# Patient Record
Sex: Female | Born: 1935 | Race: White | Hispanic: No | Marital: Married | Smoking: Never smoker
Health system: Southern US, Community
[De-identification: ages and names within clinical notes are randomized; demographics above are authoritative.]

## PROBLEM LIST (undated history)

## (undated) DIAGNOSIS — I2510 Atherosclerotic heart disease of native coronary artery without angina pectoris: Secondary | ICD-10-CM

## (undated) DIAGNOSIS — M1990 Unspecified osteoarthritis, unspecified site: Secondary | ICD-10-CM

## (undated) DIAGNOSIS — N189 Chronic kidney disease, unspecified: Secondary | ICD-10-CM

## (undated) DIAGNOSIS — F329 Major depressive disorder, single episode, unspecified: Secondary | ICD-10-CM

## (undated) DIAGNOSIS — F0390 Unspecified dementia without behavioral disturbance: Secondary | ICD-10-CM

## (undated) DIAGNOSIS — F32A Depression, unspecified: Secondary | ICD-10-CM

## (undated) DIAGNOSIS — E782 Mixed hyperlipidemia: Secondary | ICD-10-CM

## (undated) DIAGNOSIS — H409 Unspecified glaucoma: Secondary | ICD-10-CM

## (undated) DIAGNOSIS — E079 Disorder of thyroid, unspecified: Secondary | ICD-10-CM

## (undated) DIAGNOSIS — I10 Essential (primary) hypertension: Secondary | ICD-10-CM

## (undated) DIAGNOSIS — F419 Anxiety disorder, unspecified: Secondary | ICD-10-CM

## (undated) HISTORY — PX: ABDOMINAL HYSTERECTOMY: SHX81

---
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
## 2005-08-29 ENCOUNTER — Emergency Department: Payer: Self-pay | Admitting: Emergency Medicine

## 2005-08-29 ENCOUNTER — Other Ambulatory Visit: Payer: Self-pay

## 2007-08-21 ENCOUNTER — Observation Stay: Payer: Self-pay | Admitting: Endocrinology

## 2007-08-21 ENCOUNTER — Other Ambulatory Visit: Payer: Self-pay

## 2007-08-22 ENCOUNTER — Other Ambulatory Visit: Payer: Self-pay

## 2007-08-23 ENCOUNTER — Other Ambulatory Visit: Payer: Self-pay

## 2008-04-30 ENCOUNTER — Other Ambulatory Visit: Payer: Self-pay

## 2008-04-30 ENCOUNTER — Ambulatory Visit: Payer: Self-pay | Admitting: Ophthalmology

## 2008-05-13 ENCOUNTER — Ambulatory Visit: Payer: Self-pay | Admitting: Ophthalmology

## 2012-05-23 ENCOUNTER — Ambulatory Visit: Payer: Self-pay | Admitting: Family Medicine

## 2012-05-23 LAB — CREATININE, SERUM
EGFR (African American): 55 — ABNORMAL LOW
EGFR (Non-African Amer.): 48 — ABNORMAL LOW

## 2013-01-04 ENCOUNTER — Emergency Department: Payer: Self-pay | Admitting: Emergency Medicine

## 2013-10-07 ENCOUNTER — Emergency Department: Payer: Self-pay | Admitting: Emergency Medicine

## 2013-10-07 LAB — COMPREHENSIVE METABOLIC PANEL
ALBUMIN: 3.7 g/dL (ref 3.4–5.0)
ALT: 17 U/L (ref 12–78)
Alkaline Phosphatase: 63 U/L
Anion Gap: 4 — ABNORMAL LOW (ref 7–16)
BILIRUBIN TOTAL: 0.3 mg/dL (ref 0.2–1.0)
BUN: 19 mg/dL — ABNORMAL HIGH (ref 7–18)
CHLORIDE: 102 mmol/L (ref 98–107)
Calcium, Total: 8.6 mg/dL (ref 8.5–10.1)
Co2: 32 mmol/L (ref 21–32)
Creatinine: 1.05 mg/dL (ref 0.60–1.30)
EGFR (African American): 58 — ABNORMAL LOW
EGFR (Non-African Amer.): 50 — ABNORMAL LOW
GLUCOSE: 110 mg/dL — AB (ref 65–99)
Osmolality: 279 (ref 275–301)
POTASSIUM: 4.3 mmol/L (ref 3.5–5.1)
SGOT(AST): 26 U/L (ref 15–37)
SODIUM: 138 mmol/L (ref 136–145)
Total Protein: 7.2 g/dL (ref 6.4–8.2)

## 2013-10-07 LAB — URINALYSIS, COMPLETE
Bilirubin,UR: NEGATIVE
Blood: NEGATIVE
GLUCOSE, UR: NEGATIVE mg/dL (ref 0–75)
Ketone: NEGATIVE
Nitrite: NEGATIVE
PH: 7 (ref 4.5–8.0)
PROTEIN: NEGATIVE
RBC,UR: 1 /HPF (ref 0–5)
Specific Gravity: 1.009 (ref 1.003–1.030)
WBC UR: 2 /HPF (ref 0–5)

## 2013-10-07 LAB — CBC
HCT: 40.3 % (ref 35.0–47.0)
HGB: 13.4 g/dL (ref 12.0–16.0)
MCH: 28.7 pg (ref 26.0–34.0)
MCHC: 33.2 g/dL (ref 32.0–36.0)
MCV: 86 fL (ref 80–100)
Platelet: 226 10*3/uL (ref 150–440)
RBC: 4.67 10*6/uL (ref 3.80–5.20)
RDW: 13.5 % (ref 11.5–14.5)
WBC: 6 10*3/uL (ref 3.6–11.0)

## 2013-10-07 LAB — TROPONIN I: Troponin-I: <0.02 ng/mL

## 2013-10-09 ENCOUNTER — Emergency Department: Payer: Self-pay | Admitting: Emergency Medicine

## 2013-10-09 LAB — CBC
HCT: 38.8 % (ref 35.0–47.0)
HGB: 13 g/dL (ref 12.0–16.0)
MCH: 28.6 pg (ref 26.0–34.0)
MCHC: 33.4 g/dL (ref 32.0–36.0)
MCV: 86 fL (ref 80–100)
PLATELETS: 221 10*3/uL (ref 150–440)
RBC: 4.53 10*6/uL (ref 3.80–5.20)
RDW: 13.9 % (ref 11.5–14.5)
WBC: 5.7 10*3/uL (ref 3.6–11.0)

## 2013-10-09 LAB — URINALYSIS, COMPLETE
Bilirubin,UR: NEGATIVE
Blood: NEGATIVE
Glucose,UR: NEGATIVE mg/dL (ref 0–75)
Ketone: NEGATIVE
Nitrite: NEGATIVE
PROTEIN: NEGATIVE
Ph: 6 (ref 4.5–8.0)
RBC,UR: 3 /HPF (ref 0–5)
Specific Gravity: 1.021 (ref 1.003–1.030)

## 2013-10-09 LAB — COMPREHENSIVE METABOLIC PANEL
Albumin: 3.3 g/dL — ABNORMAL LOW (ref 3.4–5.0)
Alkaline Phosphatase: 57 U/L
Anion Gap: 4 — ABNORMAL LOW (ref 7–16)
BUN: 22 mg/dL — ABNORMAL HIGH (ref 7–18)
Bilirubin,Total: 0.4 mg/dL (ref 0.2–1.0)
CO2: 30 mmol/L (ref 21–32)
Calcium, Total: 8.6 mg/dL (ref 8.5–10.1)
Chloride: 103 mmol/L (ref 98–107)
Creatinine: 1.22 mg/dL (ref 0.60–1.30)
EGFR (African American): 48 — ABNORMAL LOW
GFR CALC NON AF AMER: 42 — AB
GLUCOSE: 88 mg/dL (ref 65–99)
OSMOLALITY: 277 (ref 275–301)
Potassium: 4.2 mmol/L (ref 3.5–5.1)
SGOT(AST): 23 U/L (ref 15–37)
SGPT (ALT): 15 U/L (ref 12–78)
Sodium: 137 mmol/L (ref 136–145)
TOTAL PROTEIN: 6.8 g/dL (ref 6.4–8.2)

## 2013-10-09 LAB — TSH: Thyroid Stimulating Horm: 3.69 u[IU]/mL

## 2013-10-09 LAB — TROPONIN I: Troponin-I: <0.02 ng/mL

## 2013-10-09 LAB — LIPASE, BLOOD: Lipase: 270 U/L (ref 73–393)

## 2013-10-09 LAB — T4, FREE: Free Thyroxine: 1.08 ng/dL (ref 0.76–1.46)

## 2013-10-11 LAB — URINE CULTURE

## 2013-12-16 ENCOUNTER — Ambulatory Visit: Payer: Self-pay | Admitting: Neurology

## 2015-12-13 ENCOUNTER — Encounter: Payer: Self-pay | Admitting: Emergency Medicine

## 2015-12-13 ENCOUNTER — Emergency Department: Payer: Medicare Other

## 2015-12-13 ENCOUNTER — Emergency Department
Admission: EM | Admit: 2015-12-13 | Discharge: 2015-12-13 | Disposition: A | Discharge status: Home / Self Care | Payer: Medicare Other | Attending: Emergency Medicine | Admitting: Emergency Medicine

## 2015-12-13 DIAGNOSIS — E079 Disorder of thyroid, unspecified: Secondary | ICD-10-CM | POA: Insufficient documentation

## 2015-12-13 DIAGNOSIS — M1990 Unspecified osteoarthritis, unspecified site: Secondary | ICD-10-CM | POA: Insufficient documentation

## 2015-12-13 DIAGNOSIS — R0789 Other chest pain: Secondary | ICD-10-CM | POA: Insufficient documentation

## 2015-12-13 DIAGNOSIS — R51 Headache: Secondary | ICD-10-CM | POA: Insufficient documentation

## 2015-12-13 DIAGNOSIS — I10 Essential (primary) hypertension: Secondary | ICD-10-CM | POA: Diagnosis not present

## 2015-12-13 DIAGNOSIS — R519 Headache, unspecified: Secondary | ICD-10-CM

## 2015-12-13 DIAGNOSIS — R911 Solitary pulmonary nodule: Secondary | ICD-10-CM | POA: Diagnosis not present

## 2015-12-13 DIAGNOSIS — Z791 Long term (current) use of non-steroidal anti-inflammatories (NSAID): Secondary | ICD-10-CM | POA: Insufficient documentation

## 2015-12-13 DIAGNOSIS — Z7982 Long term (current) use of aspirin: Secondary | ICD-10-CM | POA: Insufficient documentation

## 2015-12-13 DIAGNOSIS — Z79899 Other long term (current) drug therapy: Secondary | ICD-10-CM | POA: Diagnosis not present

## 2015-12-13 DIAGNOSIS — R079 Chest pain, unspecified: Secondary | ICD-10-CM

## 2015-12-13 HISTORY — DX: Anxiety disorder, unspecified: F41.9

## 2015-12-13 HISTORY — DX: Essential (primary) hypertension: I10

## 2015-12-13 HISTORY — DX: Disorder of thyroid, unspecified: E07.9

## 2015-12-13 HISTORY — DX: Unspecified osteoarthritis, unspecified site: M19.90

## 2015-12-13 HISTORY — DX: Unspecified dementia, unspecified severity, without behavioral disturbance, psychotic disturbance, mood disturbance, and anxiety: F03.90

## 2015-12-13 LAB — CBC
HEMATOCRIT: 37.9 % (ref 35.0–47.0)
HEMOGLOBIN: 12.8 g/dL (ref 12.0–16.0)
MCH: 28.5 pg (ref 26.0–34.0)
MCHC: 33.8 g/dL (ref 32.0–36.0)
MCV: 84.3 fL (ref 80.0–100.0)
Platelets: 269 10*3/uL (ref 150–440)
RBC: 4.49 MIL/uL (ref 3.80–5.20)
RDW: 14.5 % (ref 11.5–14.5)
WBC: 7.9 10*3/uL (ref 3.6–11.0)

## 2015-12-13 LAB — TROPONIN I: Troponin I: <0.03 ng/mL (ref <0.031)

## 2015-12-13 LAB — BASIC METABOLIC PANEL
ANION GAP: 7 (ref 5–15)
BUN: 18 mg/dL (ref 6–20)
CO2: 31 mmol/L (ref 22–32)
Calcium: 8.8 mg/dL — ABNORMAL LOW (ref 8.9–10.3)
Chloride: 101 mmol/L (ref 101–111)
Creatinine, Ser: 0.94 mg/dL (ref 0.44–1.00)
GFR calc Af Amer: >60 mL/min (ref >60)
GFR, EST NON AFRICAN AMERICAN: 55 mL/min — AB (ref >60)
GLUCOSE: 95 mg/dL (ref 65–99)
POTASSIUM: 3.6 mmol/L (ref 3.5–5.1)
Sodium: 139 mmol/L (ref 135–145)

## 2015-12-13 MED ORDER — IOPAMIDOL (ISOVUE-370) INJECTION 76%
100.0000 mL | Freq: Once | INTRAVENOUS | Status: AC | PRN
  Administered 2015-12-13: 100 mL via INTRAVENOUS

## 2015-12-13 MED ORDER — ATENOLOL 25 MG PO TABS
25.0000 mg | ORAL_TABLET | Freq: Once | ORAL | Status: AC
  Administered 2015-12-13: 25 mg via ORAL
  Filled 2015-12-13: qty 1

## 2015-12-13 NOTE — Discharge Instructions (Signed)
You were evaluated for chest discomfort, and although no certain cause was found, your exam and evaluation are reassuring in the emergency department today.  He will be referred to cardiology, Dr. Kirke Corin for close follow-up and possible stress testing.  Return to the emergency department for any worsening condition including trouble breathing, worsening chest pain, weakness, numbness, fever, dizziness or passing out, or any other symptoms concerning to you.   Nonspecific Chest Pain  Chest pain can be caused by many different conditions. There is always a chance that your pain could be related to something serious, such as a heart attack or a blood clot in your lungs. Chest pain can also be caused by conditions that are not life-threatening. If you have chest pain, it is very important to follow up with your health care provider. CAUSES  Chest pain can be caused by:  Heartburn.  Pneumonia or bronchitis.  Anxiety or stress.  Inflammation around your heart (pericarditis) or lung (pleuritis or pleurisy).  A blood clot in your lung.  A collapsed lung (pneumothorax). It can develop suddenly on its own (spontaneous pneumothorax) or from trauma to the chest.  Shingles infection (varicella-zoster virus).  Heart attack.  Damage to the bones, muscles, and cartilage that make up your chest wall. This can include:  Bruised bones due to injury.  Strained muscles or cartilage due to frequent or repeated coughing or overwork.  Fracture to one or more ribs.  Sore cartilage due to inflammation (costochondritis). RISK FACTORS  Risk factors for chest pain may include:  Activities that increase your risk for trauma or injury to your chest.  Respiratory infections or conditions that cause frequent coughing.  Medical conditions or overeating that can cause heartburn.  Heart disease or family history of heart disease.  Conditions or health behaviors that increase your risk of developing a  blood clot.  Having had chicken pox (varicella zoster). SIGNS AND SYMPTOMS Chest pain can feel like:  Burning or tingling on the surface of your chest or deep in your chest.  Crushing, pressure, aching, or squeezing pain.  Dull or sharp pain that is worse when you move, cough, or take a deep breath.  Pain that is also felt in your back, neck, shoulder, or arm, or pain that spreads to any of these areas. Your chest pain may come and go, or it may stay constant. DIAGNOSIS Lab tests or other studies may be needed to find the cause of your pain. Your health care provider may have you take a test called an ambulatory ECG (electrocardiogram). An ECG records your heartbeat patterns at the time the test is performed. You may also have other tests, such as:  Transthoracic echocardiogram (TTE). During echocardiography, sound waves are used to create a picture of all of the heart structures and to look at how blood flows through your heart.  Transesophageal echocardiogram (TEE).This is a more advanced imaging test that obtains images from inside your body. It allows your health care provider to see your heart in finer detail.  Cardiac monitoring. This allows your health care provider to monitor your heart rate and rhythm in real time.  Holter monitor. This is a portable device that records your heartbeat and can help to diagnose abnormal heartbeats. It allows your health care provider to track your heart activity for several days, if needed.  Stress tests. These can be done through exercise or by taking medicine that makes your heart beat more quickly.  Blood tests.  Imaging tests. TREATMENT  Your treatment depends on what is causing your chest pain. Treatment may include:  Medicines. These may include:  Acid blockers for heartburn.  Anti-inflammatory medicine.  Pain medicine for inflammatory conditions.  Antibiotic medicine, if an infection is present.  Medicines to dissolve blood  clots.  Medicines to treat coronary artery disease.  Supportive care for conditions that do not require medicines. This may include:  Resting.  Applying heat or cold packs to injured areas.  Limiting activities until pain decreases. HOME CARE INSTRUCTIONS  If you were prescribed an antibiotic medicine, finish it all even if you start to feel better.  Avoid any activities that bring on chest pain.  Do not use any tobacco products, including cigarettes, chewing tobacco, or electronic cigarettes. If you need help quitting, ask your health care provider.  Do not drink alcohol.  Take medicines only as directed by your health care provider.  Keep all follow-up visits as directed by your health care provider. This is important. This includes any further testing if your chest pain does not go away.  If heartburn is the cause for your chest pain, you may be told to keep your head raised (elevated) while sleeping. This reduces the chance that acid will go from your stomach into your esophagus.  Make lifestyle changes as directed by your health care provider. These may include:  Getting regular exercise. Ask your health care provider to suggest some activities that are safe for you.  Eating a heart-healthy diet. A registered dietitian can help you to learn healthy eating options.  Maintaining a healthy weight.  Managing diabetes, if necessary.  Reducing stress. SEEK MEDICAL CARE IF:  Your chest pain does not go away after treatment.  You have a rash with blisters on your chest.  You have a fever. SEEK IMMEDIATE MEDICAL CARE IF:   Your chest pain is worse.  You have an increasing cough, or you cough up blood.  You have severe abdominal pain.  You have severe weakness.  You faint.  You have chills.  You have sudden, unexplained chest discomfort.  You have sudden, unexplained discomfort in your arms, back, neck, or jaw.  You have shortness of breath at any  time.  You suddenly start to sweat, or your skin gets clammy.  You feel nauseous or you vomit.  You suddenly feel light-headed or dizzy.  Your heart begins to beat quickly, or it feels like it is skipping beats. These symptoms may represent a serious problem that is an emergency. Do not wait to see if the symptoms will go away. Get medical help right away. Call your local emergency services (911 in the [place] Do not drive yourself to the hospital.   This information is not intended to replace advice given to you by your health care provider. Make sure you discuss any questions you have with your health care provider.   Document Released: 04/26/2005 Document Revised: 08/07/2014 Document Reviewed: 02/20/2014 Elsevier Interactive Patient Education [employer]2016 Elsevier Inc.

## 2015-12-13 NOTE — ED Notes (Signed)
[facility] and stated that she began having bilateral chest pain that radiates down bilateral arms, that started about an hour prior to arrival.  EMS called.  Pt in NAD.

## 2015-12-13 NOTE — ED Provider Notes (Signed)
[facility] Provider Note   ____________________________________________  Time seen: I have reviewed the triage vital signs and the triage nursing note.  HISTORY  Chief Complaint Chest Pain   Historian Patient and multiple children  HPI Kelly [facility] is a 82 y.o. female with a history of hypertension, and anxiety, as well as some dementia, with no known coronary artery disease, is here for evaluation of chest discomfort which occurred essentially at rest this morning and was located in the center of the chest and extended to both arms. Mild nausea no vomiting. No abdominal pain. Denies GERD symptoms. No trouble breathing or coughing or fevers recently. She has had a mild global headache for couple of days. No weakness or numbness or confusion or new altered mental status.  Chest discomfort is mild now. Not made worse or better by anything. Not exertional. No palpitations.    Past Medical History  Diagnosis Date  . Hypertension   . Arthritis   . Dementia   . Thyroid disease   . Anxiety     There are no active problems to display for this patient.   Past Surgical History  Procedure Laterality Date  . Abdominal hysterectomy      Current Outpatient Rx  Name  Route  Sig  Dispense  Refill  . acetaminophen (TYLENOL) 500 MG tablet   Oral   Take 1,000 mg by mouth 2 (two) times daily. Pt also takes one tablet every six hours as needed for pain.         Marland Kitchen aspirin 81 MG chewable tablet   Oral   Chew 81 mg by mouth daily.         Marland Kitchen atenolol (TENORMIN) 25 MG tablet   Oral   Take 25 mg by mouth daily.         . cholecalciferol (VITAMIN D) 1000 units tablet   Oral   Take 2,000 Units by mouth daily.         Marland Kitchen docusate sodium (COLACE) 100 MG capsule   Oral   Take 100 mg by mouth daily.         Marland Kitchen donepezil (ARICEPT) 5 MG tablet   Oral   Take 5 mg by mouth at bedtime.         Marland Kitchen ipratropium (ATROVENT) 0.06 %  nasal spray   Each Nare   Place 2 sprays into both nostrils 3 (three) times daily.         Marland Kitchen lamoTRIgine (LAMICTAL) 25 MG tablet   Oral   Take 50 mg by mouth at bedtime.         Marland Kitchen latanoprost (XALATAN) 0.005 % ophthalmic solution   Right Eye   Place 1 drop into the right eye at bedtime.         Marland Kitchen levothyroxine (SYNTHROID, LEVOTHROID) 100 MCG tablet   Oral   Take 100 mcg by mouth daily before breakfast.         . loratadine (CLARITIN) 10 MG tablet   Oral   Take 10 mg by mouth daily.         Marland Kitchen losartan-hydrochlorothiazide (HYZAAR) 50-12.5 MG tablet   Oral   Take 1 tablet by mouth daily.         . Melatonin 3 MG TABS   Oral   Take 6 mg by mouth daily with supper.         . Omega-3 Fatty Acids (FISH OIL) 1200 MG CAPS   Oral  Take 1,200 mg by mouth daily.         . polyvinyl alcohol (LIQUIFILM TEARS) 1.4 % ophthalmic solution   Both Eyes   Place 1 drop into both eyes 4 (four) times daily.         . sertraline (ZOLOFT) 100 MG tablet   Oral   Take 100 mg by mouth daily.         Marland Kitchen triamcinolone cream (KENALOG) 0.1 %   Topical   Apply 1 application topically 2 (two) times daily.           Allergies Sudafed  No family history on file.  Social History Social History  Substance Use Topics  . Smoking status: Never Smoker   . Smokeless tobacco: None  . Alcohol Use: No    Review of Systems  Constitutional: Negative for fever. Eyes: Negative for visual changes. ENT: Negative for sore throat. Cardiovascular: Negative for Palpitations. Respiratory: Negative for shortness of breath. Gastrointestinal: Negative for abdominal pain, vomiting and diarrhea. Genitourinary: Negative for dysuria. Musculoskeletal: Negative for back pain. Skin: Negative for rash. Neurological: Positive for mild headache. 10 point Review of Systems otherwise negative ____________________________________________   PHYSICAL EXAM:  VITAL SIGNS: ED Triage Vitals  Enc  Vitals Group     BP 12/13/15 1030 168/89 mmHg     Pulse Rate 12/13/15 1030 69     Resp 12/13/15 1030 26     Temp 12/13/15 1030 97.6 F (36.4 C)     Temp Source 12/13/15 1030 Oral     SpO2 12/13/15 1030 98 %     Weight 12/13/15 1045 184 lb (83.462 kg)     Height 12/13/15 1045 5\' 4"  (1.626 m)     Head Cir --      Peak Flow --      Pain Score 12/13/15 1031 9     Pain Loc --      Pain Edu? --      Excl. in GC? --      Constitutional: Alert and oriented. Well appearing and in no distress. HEENT   Head: Normocephalic and atraumatic.      Eyes: Conjunctivae are normal. PERRL. Normal extraocular movements.      Ears:         Nose: No congestion/rhinnorhea.   Mouth/Throat: Mucous membranes are moist.   Neck: No stridor. Cardiovascular/Chest: Normal rate, regular rhythm.  No murmurs, rubs, or gallops. Respiratory: Normal respiratory effort without tachypnea nor retractions. Breath sounds are clear and equal bilaterally. No wheezes/rales/rhonchi. Gastrointestinal: Soft. No distention, no guarding, no rebound. Nontender.    Genitourinary/rectal:Deferred Musculoskeletal: Nontender with normal range of motion in all extremities. No joint effusions.  No lower extremity tenderness.  No edema. Neurologic:  Normal speech and language. No gross or focal neurologic deficits are appreciated. Skin:  Skin is warm, dry and intact. No rash noted. Psychiatric: Mood and affect are normal. Speech and behavior are normal. Patient exhibits appropriate insight and judgment.  ____________________________________________   EKG I, Governor Rooks, MD, the attending physician have personally viewed and interpreted all ECGs.  69 bpm. Normal sinus rhythm. Incomplete left bundle branch block. Normal axis. Nonspecific T-wave ____________________________________________  LABS (pertinent positives/negatives)  Basic metabolic panel within normal limits CBC within normal limits Troponin less than  0.03 Repeat troponin less than 0.03  ____________________________________________  RADIOLOGY All Xrays were viewed by me. Imaging interpreted by Radiologist.  Chest two-view: No active cardiopulmonary disease  CT head without contrast: No evidence of acute intracranial  abnormality.  CT angio chest aorta:  IMPRESSION: No evidence of pulmonary embolus.  Atherosclerosis thoracic aorta without aneurysm or dissection.  Focal ill-defined opacity seen posteriorly in right upper lobe concerning for atelectasis, inflammation or possibly scarring.  4 mm nodule seen in right upper lobe. No follow-up needed if patient is low-risk. Non-contrast chest CT can be considered in 12 months if patient is high-risk. This recommendation follows the consensus statement: Guidelines for Management of Incidental Pulmonary Nodules Detected on CT Images:From the Fleischner Society 2017; published online before print (10.1148/[id number]). __________________________________________  PROCEDURES  Procedure(s) performed: None  Critical Care performed: None  ____________________________________________   ED COURSE / ASSESSMENT AND PLAN  Pertinent labs & imaging results that were available during my care of the patient were reviewed by me and considered in my medical decision making (see chart for details).   She came here because of chest discomfort, and her symptoms sound atypical for cardiac, however she is a couple risk factors and has not had coronary artery disease workup in the past.  Her EKG is reassuring. Her troponin as well as repeat troponin are also reassuring.  Her symptoms do not sound good leg. Her symptoms do not sound like pulmonary embolus.  If anything, was a little concerned about vascular or aortic emergency and I did obtain a chest CT with contrast and this was negative for acute emergency. I did discuss with her the need for follow-up for the pulmonary nodule that was  seen as a nonemergency follow-up with her primary care physician.  I did also CT her head out of a complaint of generalized headache for couple of days. Next the next line originally I was considering CT angiogram of the neck, but without one-sided pain I think dissection of the carotid artery is less likely, and was only able to do either chest or neck given the contrast dye load.  Her exam and evaluation are reassuring today. I am going to have her follow-up with cardiology I spoke with Dr. Kirke CorinArida who was office staff will call her for an appointment in the next 1-2 days.    CONSULTATIONS:   I spoke with Dr. Kirke CorinArida, cardiology by phone.   Patient / Family / Caregiver informed of clinical course, medical decision-making process, and agree with plan.   I discussed return precautions, follow-up instructions, and discharged instructions with patient and/or family.   ___________________________________________   FINAL CLINICAL IMPRESSION(S) / ED DIAGNOSES   Final diagnoses:  Nonspecific chest pain  Nodule of right lung  Acute nonintractable headache, unspecified headache type              Note: This dictation was prepared with Dragon dictation. Any transcriptional errors that result from this process are unintentional   Governor Rooksebecca Milik Gilreath, MD 12/13/15 1557

## 2016-06-06 ENCOUNTER — Encounter
Admission: RE | Disposition: A | Discharge status: Home / Self Care | Payer: Self-pay | Source: Ambulatory Visit | Attending: Internal Medicine

## 2016-06-06 ENCOUNTER — Encounter: Payer: Self-pay | Admitting: Internal Medicine

## 2016-06-06 ENCOUNTER — Ambulatory Visit
Admission: RE | Admit: 2016-06-06 | Discharge: 2016-06-06 | Disposition: A | Discharge status: Home / Self Care | Payer: Medicare Other | Source: Ambulatory Visit | Attending: Internal Medicine | Admitting: Internal Medicine

## 2016-06-06 DIAGNOSIS — R0602 Shortness of breath: Secondary | ICD-10-CM | POA: Diagnosis present

## 2016-06-06 DIAGNOSIS — Z791 Long term (current) use of non-steroidal anti-inflammatories (NSAID): Secondary | ICD-10-CM | POA: Insufficient documentation

## 2016-06-06 DIAGNOSIS — E039 Hypothyroidism, unspecified: Secondary | ICD-10-CM | POA: Insufficient documentation

## 2016-06-06 DIAGNOSIS — E785 Hyperlipidemia, unspecified: Secondary | ICD-10-CM | POA: Diagnosis not present

## 2016-06-06 DIAGNOSIS — E7800 Pure hypercholesterolemia, unspecified: Secondary | ICD-10-CM | POA: Insufficient documentation

## 2016-06-06 DIAGNOSIS — Z79899 Other long term (current) drug therapy: Secondary | ICD-10-CM | POA: Diagnosis not present

## 2016-06-06 DIAGNOSIS — F329 Major depressive disorder, single episode, unspecified: Secondary | ICD-10-CM | POA: Diagnosis not present

## 2016-06-06 DIAGNOSIS — Z7982 Long term (current) use of aspirin: Secondary | ICD-10-CM | POA: Insufficient documentation

## 2016-06-06 DIAGNOSIS — F0390 Unspecified dementia without behavioral disturbance: Secondary | ICD-10-CM | POA: Diagnosis not present

## 2016-06-06 DIAGNOSIS — I25119 Atherosclerotic heart disease of native coronary artery with unspecified angina pectoris: Secondary | ICD-10-CM | POA: Insufficient documentation

## 2016-06-06 DIAGNOSIS — I10 Essential (primary) hypertension: Secondary | ICD-10-CM | POA: Insufficient documentation

## 2016-06-06 DIAGNOSIS — I208 Other forms of angina pectoris: Secondary | ICD-10-CM | POA: Diagnosis present

## 2016-06-06 HISTORY — PX: CARDIAC CATHETERIZATION: SHX172

## 2016-06-06 SURGERY — LEFT HEART CATH AND CORONARY ANGIOGRAPHY
Anesthesia: Moderate Sedation | Laterality: Left

## 2016-06-06 MED ORDER — HEPARIN (PORCINE) IN NACL 2-0.9 UNIT/ML-% IJ SOLN
INTRAMUSCULAR | Status: AC
  Filled 2016-06-06: qty 500

## 2016-06-06 MED ORDER — IOPAMIDOL (ISOVUE-300) INJECTION 61%
INTRAVENOUS | Status: DC | PRN
  Administered 2016-06-06: 130 mL via INTRA_ARTERIAL

## 2016-06-06 MED ORDER — FENTANYL CITRATE (PF) 100 MCG/2ML IJ SOLN
INTRAMUSCULAR | Status: AC
  Filled 2016-06-06: qty 2

## 2016-06-06 MED ORDER — SODIUM CHLORIDE 0.9 % IV SOLN
INTRAVENOUS | Status: DC
  Administered 2016-06-06: 12:00:00 via INTRAVENOUS

## 2016-06-06 MED ORDER — SODIUM CHLORIDE 0.9% FLUSH
10.0000 mL | Freq: Once | INTRAVENOUS | Status: AC
  Administered 2016-06-06: 10 mL via INTRAVENOUS

## 2016-06-06 MED ORDER — MIDAZOLAM HCL 2 MG/2ML IJ SOLN
INTRAMUSCULAR | Status: AC
  Filled 2016-06-06: qty 2

## 2016-06-06 MED ORDER — MIDAZOLAM HCL 2 MG/2ML IJ SOLN
INTRAMUSCULAR | Status: DC | PRN
  Administered 2016-06-06: 1 mg via INTRAVENOUS

## 2016-06-06 MED ORDER — FENTANYL CITRATE (PF) 100 MCG/2ML IJ SOLN
INTRAMUSCULAR | Status: DC | PRN
  Administered 2016-06-06: 25 ug via INTRAVENOUS

## 2016-06-06 SURGICAL SUPPLY — 9 items
CATH 5FR JR4 DIAGNOSTIC (CATHETERS) ×3 IMPLANT
CATH INFINITI 5FR ANG PIGTAIL (CATHETERS) ×3 IMPLANT
CATH INFINITI 5FR JL4 (CATHETERS) ×3 IMPLANT
DEVICE CLOSURE MYNXGRIP 5F (Vascular Products) ×3 IMPLANT
GUIDEWIRE 3MM J TIP .035 145 (WIRE) ×3 IMPLANT
KIT MANI 3VAL PERCEP (MISCELLANEOUS) ×3 IMPLANT
NEEDLE PERC 18GX7CM (NEEDLE) ×3 IMPLANT
PACK CARDIAC CATH (CUSTOM PROCEDURE TRAY) ×3 IMPLANT
SHEATH PINNACLE 5F 10CM (SHEATH) ×3 IMPLANT

## 2016-06-06 NOTE — Discharge Instructions (Signed)
Angiogram, Care After °Refer to this sheet in the next few weeks. These instructions provide you with information about caring for yourself after your procedure. Your health care provider may also give you more specific instructions. Your treatment has been planned according to current medical practices, but problems sometimes occur. Call your health care provider if you have any problems or questions after your procedure. °WHAT TO EXPECT AFTER THE PROCEDURE °After your procedure, it is typical to have the following: °Bruising at the catheter insertion site that usually fades within 1-2 weeks. °Blood collecting in the tissue (hematoma) that may be painful to the touch. It should usually decrease in size and tenderness within 1-2 weeks. °HOME CARE INSTRUCTIONS °Take medicines only as directed by your health care provider. °You may shower 24-48 hours after the procedure or as directed by your health care provider. Remove the bandage (dressing) and gently wash the site with plain soap and water. Pat the area dry with a clean towel. Do not rub the site, because this may cause bleeding. °Do not take baths, swim, or use a hot tub until your health care provider approves. °Check your insertion site every day for redness, swelling, or drainage. °Do not apply powder or lotion to the site. °Do not lift over 10 lb (4.5 kg) for 5 days after your procedure or as directed by your health care provider. °Ask your health care provider when it is okay to: °Return to work or school. °Resume usual physical activities or sports. °Resume sexual activity. °Do not drive home if you are discharged the same day as the procedure. Have someone else drive you. °You may drive 24 hours after the procedure unless otherwise instructed by your health care provider. °Do not operate machinery or power tools for 24 hours after the procedure or as directed by your health care provider. °If your procedure was done as an outpatient procedure, which means  that you went home the same day as your procedure, a responsible adult should be with you for the first 24 hours after you arrive home. °Keep all follow-up visits as directed by your health care provider. This is important. °SEEK MEDICAL CARE IF: °You have a fever. °You have chills. °You have increased bleeding from the catheter insertion site. Hold pressure on the site. °SEEK IMMEDIATE MEDICAL CARE IF: °You have unusual pain at the catheter insertion site. °You have redness, warmth, or swelling at the catheter insertion site. °You have drainage (other than a small amount of blood on the dressing) from the catheter insertion site. °The catheter insertion site is bleeding, and the bleeding does not stop after 30 minutes of holding steady pressure on the site. °The area near or just beyond the catheter insertion site becomes pale, cool, tingly, or numb. °  °This information is not intended to replace advice given to you by your health care provider. Make sure you discuss any questions you have with your health care provider. °  °Document Released: 02/02/2005 Document Revised: 08/07/2014 Document Reviewed: 12/18/2012 °Elsevier Interactive Patient Education ©2016 Elsevier Inc. °Coronary Angiogram °A coronary angiogram, also called coronary angiography, is an X-ray procedure used to look at the arteries in the heart. In this procedure, a dye (contrast dye) is injected through a long, hollow tube (catheter). The catheter is about the size of a piece of cooked spaghetti and is inserted through your groin, wrist, or arm. The dye is injected into each artery, and X-rays are then taken to show if there is a blockage   in the arteries of your heart. °LET YOUR HEALTH CARE PROVIDER KNOW ABOUT: °· Any allergies you have, including allergies to shellfish or contrast dye.   °· All medicines you are taking, including vitamins, herbs, eye drops, creams, and over-the-counter medicines.   °· Previous problems you or members of your  family have had with the use of anesthetics.   °· Any blood disorders you have.   °· Previous surgeries you have had. °· History of kidney problems or failure.   °· Other medical conditions you have. °RISKS AND COMPLICATIONS  °Generally, a coronary angiogram is a safe procedure. However, problems can occur and include: °· Allergic reaction to the dye. °· Bleeding from the access site or other locations. °· Kidney injury, especially in people with impaired kidney function.  °· Stroke (rare). °· Heart attack (rare). °BEFORE THE PROCEDURE  °· Do not eat or drink anything after midnight the night before the procedure or as directed by your health care provider.   °· Ask your health care provider about changing or stopping your regular medicines. This is especially important if you are taking diabetes medicines or blood thinners. °PROCEDURE °· You may be given a medicine to help you relax (sedative) before the procedure. This medicine is given through an intravenous (IV) access tube that is inserted into one of your veins.   °· The area where the catheter will be inserted will be washed and shaved. This is usually done in the groin but may be done in the fold of your arm (near your elbow) or in the wrist.    °· A medicine will be given to numb the area where the catheter will be inserted (local anesthetic).   °· The health care provider will insert the catheter into an artery. The catheter will be guided by using a special type of X-ray (fluoroscopy) of the blood vessel being examined.   °· A special dye will then be injected into the catheter, and X-rays will be taken. The dye will help to show where any narrowing or blockages are located in the heart arteries.   °AFTER THE PROCEDURE  °· If the procedure is done through the leg, you will be kept in bed lying flat for several hours. You will be instructed to not bend or cross your legs. °· The insertion site will be checked frequently.   °· The pulse in your feet or  wrist will be checked frequently.   °· Additional blood tests, X-rays, and an electrocardiogram may be done.   °  °This information is not intended to replace advice given to you by your health care provider. Make sure you discuss any questions you have with your health care provider. °  °Document Released: 01/21/2003 Document Revised: 08/07/2014 Document Reviewed: 12/09/2012 °Elsevier Interactive Patient Education ©2016 Else[employer] Inc. ° °

## 2016-07-21 ENCOUNTER — Ambulatory Visit: Payer: Medicare Other | Attending: Internal Medicine

## 2016-07-21 DIAGNOSIS — G4733 Obstructive sleep apnea (adult) (pediatric): Secondary | ICD-10-CM | POA: Insufficient documentation

## 2016-07-21 DIAGNOSIS — G4761 Periodic limb movement disorder: Secondary | ICD-10-CM | POA: Insufficient documentation

## 2017-10-20 ENCOUNTER — Emergency Department
Admission: EM | Admit: 2017-10-20 | Discharge: 2017-10-21 | Disposition: A | Discharge status: Home / Self Care | Payer: Medicare Other | Attending: Emergency Medicine | Admitting: Emergency Medicine

## 2017-10-20 ENCOUNTER — Other Ambulatory Visit: Payer: Self-pay

## 2017-10-20 ENCOUNTER — Emergency Department: Payer: Medicare Other

## 2017-10-20 DIAGNOSIS — Z79899 Other long term (current) drug therapy: Secondary | ICD-10-CM | POA: Diagnosis not present

## 2017-10-20 DIAGNOSIS — F0390 Unspecified dementia without behavioral disturbance: Secondary | ICD-10-CM | POA: Diagnosis not present

## 2017-10-20 DIAGNOSIS — Z7982 Long term (current) use of aspirin: Secondary | ICD-10-CM | POA: Insufficient documentation

## 2017-10-20 DIAGNOSIS — R1030 Lower abdominal pain, unspecified: Secondary | ICD-10-CM

## 2017-10-20 DIAGNOSIS — K5900 Constipation, unspecified: Secondary | ICD-10-CM | POA: Insufficient documentation

## 2017-10-20 DIAGNOSIS — I10 Essential (primary) hypertension: Secondary | ICD-10-CM | POA: Diagnosis not present

## 2017-10-20 NOTE — ED Notes (Signed)
Pt disimpacted per dr. Zenda AlpersWebster of large amount of brown hard stool, approx 1/2 of bedpan full. Pt tolerated procedure well.

## 2017-10-20 NOTE — ED Triage Notes (Signed)
Pt sent in for constipation - was given a fleets enema at 6pm by nh with no results. Hx of constipation

## 2017-10-21 ENCOUNTER — Encounter: Payer: Self-pay | Admitting: Radiology

## 2017-10-21 ENCOUNTER — Emergency Department: Payer: Medicare Other

## 2017-10-21 LAB — COMPREHENSIVE METABOLIC PANEL
ALK PHOS: 50 U/L (ref 38–126)
ALT: 10 U/L — AB (ref 14–54)
ANION GAP: 15 (ref 5–15)
AST: 29 U/L (ref 15–41)
Albumin: 4.2 g/dL (ref 3.5–5.0)
BUN: 24 mg/dL — ABNORMAL HIGH (ref 6–20)
CALCIUM: 9.1 mg/dL (ref 8.9–10.3)
CO2: 24 mmol/L (ref 22–32)
CREATININE: 1.21 mg/dL — AB (ref 0.44–1.00)
Chloride: 92 mmol/L — ABNORMAL LOW (ref 101–111)
GFR, EST AFRICAN AMERICAN: 46 mL/min — AB (ref >60)
GFR, EST NON AFRICAN AMERICAN: 40 mL/min — AB (ref >60)
Glucose, Bld: 123 mg/dL — ABNORMAL HIGH (ref 65–99)
Potassium: 3.1 mmol/L — ABNORMAL LOW (ref 3.5–5.1)
SODIUM: 131 mmol/L — AB (ref 135–145)
Total Bilirubin: 0.7 mg/dL (ref 0.3–1.2)
Total Protein: 7.9 g/dL (ref 6.5–8.1)

## 2017-10-21 LAB — CBC
HCT: 35.8 % (ref 35.0–47.0)
HEMOGLOBIN: 12.1 g/dL (ref 12.0–16.0)
MCH: 28.3 pg (ref 26.0–34.0)
MCHC: 33.8 g/dL (ref 32.0–36.0)
MCV: 83.8 fL (ref 80.0–100.0)
Platelets: 276 10*3/uL (ref 150–440)
RBC: 4.28 MIL/uL (ref 3.80–5.20)
RDW: 14.8 % — ABNORMAL HIGH (ref 11.5–14.5)
WBC: 13.6 10*3/uL — ABNORMAL HIGH (ref 3.6–11.0)

## 2017-10-21 MED ORDER — POLYETHYLENE GLYCOL 3350 17 G PO PACK: 17.0000 g | PACK | Freq: Two times a day (BID) | ORAL | 0 refills | Status: AC

## 2017-10-21 MED ORDER — LACTULOSE 10 GM/15ML PO SOLN: 10.0000 g | Freq: Two times a day (BID) | ORAL | 0 refills | Status: DC

## 2017-10-21 MED ORDER — SODIUM CHLORIDE 0.9 % IV BOLUS (SEPSIS)
500.0000 mL | Freq: Once | INTRAVENOUS | Status: AC
  Administered 2017-10-21: 500 mL via INTRAVENOUS

## 2017-10-21 MED ORDER — IOPAMIDOL (ISOVUE-300) INJECTION 61%
75.0000 mL | Freq: Once | INTRAVENOUS | Status: AC | PRN
  Administered 2017-10-21: 75 mL via INTRAVENOUS

## 2017-10-21 NOTE — ED Notes (Signed)
Pt assisted up to commode for possible stool production without results. Pt assisted back to bed, warm blankets provided, call bell at right side.

## 2017-10-21 NOTE — Discharge Instructions (Addendum)
Please take MiraLAX twice a day for the next 7 days as well as lactulose to help induce bowel movements.  Please ensure that you are drinking adequate amounts of water and eating a high-fiber diet.  Please return with any fevers, vomiting or any other concerns.

## 2017-10-21 NOTE — ED Provider Notes (Signed)
[facility] Emergency Department Provider Note   ____________________________________________   First MD Initiated Contact with Patient 10/20/17 2317     (approximate)  I have reviewed the triage vital signs and the nursing notes.   HISTORY  Chief Complaint Constipation    HPI Kelly Sullivan is a 84 y.o. female who comes into the hospital today stating that her bowels are locked.  The patient's family states that she is been complaining for a few months about not being able to go to the restroom.  They have been to the doctor and was told it was due to medication but she is not sure exactly what medication is causing her constipation.  The patient states that she went to the doctor earlier this week and was told that she is not constipated after they pressed on her belly but the plan was to do more testing should the patient still complained of constipation.  She has been taking Colace and another medication that she does not know.  The family states that she is lost about 40 pounds in the last 4-5 months.  She was hurting all days of the nursing home center here for evaluation.  She is been trying to go to the bathroom.  The patient rates her pain 8-9 out of 10 in intensity.  She has not had any nausea or vomiting nor has she had any chest pain or shortness of breath.  The patient is here today for abdominal pain and constipation.  Past Medical History:  Diagnosis Date  . Anxiety   . Arthritis   . Dementia   . Hypertension   . Thyroid disease     Patient Active Problem List   Diagnosis Date Noted  . Stable angina (HCC) 06/06/2016    Past Surgical History:  Procedure Laterality Date  . ABDOMINAL HYSTERECTOMY    . CARDIAC CATHETERIZATION Left 06/06/2016   Procedure: Left Heart Cath and Coronary Angiography;  Surgeon: Lamar Blinks, MD;  Location: [facility] INVASIVE CV LAB;  Service: Cardiovascular;  Laterality: Left;    Prior to Admission medications    Medication Sig Start Date End Date Taking? Authorizing Provider  acetaminophen (TYLENOL) 500 MG tablet Take 1,000 mg by mouth 2 (two) times daily. Pt also takes one tablet every six hours as needed for pain.    [provider]  aspirin 81 MG chewable tablet Chew 81 mg by mouth daily.    [provider]  azelastine (ASTELIN) 0.1 % nasal spray Place 2 sprays into both nostrils 2 (two) times daily. Use in each nostril as directed    [provider]  Brinzolamide-Brimonidine (SIMBRINZA) 1-0.2 % SUSP Place 1 drop into the right eye 2 (two) times daily.    [provider]  cholecalciferol (VITAMIN D) 1000 units tablet Take 2,000 Units by mouth daily.    [provider]  docusate sodium (COLACE) 100 MG capsule Take 100 mg by mouth daily.    [provider]  donepezil (ARICEPT) 5 MG tablet Take 5 mg by mouth at bedtime.    [provider]  lactulose (CHRONULAC) 10 GM/15ML solution Take 15 mLs (10 g total) by mouth 2 (two) times daily. 10/21/17   Rebecka Apley, MD  latanoprost (XALATAN) 0.005 % ophthalmic solution Place 1 drop into the right eye at bedtime.    [provider]  levothyroxine (SYNTHROID, LEVOTHROID) 100 MCG tablet Take 100 mcg by mouth daily before breakfast.    [provider]  loratadine (CLARITIN) 10 MG tablet Take 10 mg by mouth daily.    [provider]  losartan-hydrochlorothiazide (HYZAAR) 100-25 MG tablet Take 1 tablet by mouth daily.    [provider]  Omega-3 Fatty Acids (FISH OIL) 1200 MG CAPS Take 1,200 mg by mouth daily.    [provider]  polyethylene glycol (MIRALAX) packet Take 17 g by mouth 2 (two) times daily. 10/21/17   Rebecka Apley, MD  polyvinyl alcohol (LIQUIFILM TEARS) 1.4 % ophthalmic solution Place 1 drop into both eyes 4 (four) times daily.    [provider]  PRESCRIPTION MEDICATION Place 1 application into both ears 2 (two) times daily as  needed (itching). Mometasone furoate 0.1% solution    [provider]  sertraline (ZOLOFT) 100 MG tablet Take 200 mg by mouth daily.     [provider]    Allergies Sudafed [pseudoephedrine hcl]  No family history on file.  Social History Social History   Tobacco Use  . Smoking status: Never Smoker  Substance Use Topics  . Alcohol use: No  . Drug use: Not on file    Review of Systems  Constitutional: No fever/chills Eyes: No visual changes. ENT: No sore throat. Cardiovascular: Denies chest pain. Respiratory: Denies shortness of breath. Gastrointestinal:  abdominal pain and constipation with no nausea, no vomiting.   Genitourinary: Negative for dysuria. Musculoskeletal: Negative for back pain. Skin: Negative for rash. Neurological: Negative for headaches,    ____________________________________________   PHYSICAL EXAM:  VITAL SIGNS: ED Triage Vitals  Enc Vitals Group     BP 10/20/17 2219 (!) 155/65     Pulse Rate 10/20/17 2222 82     Resp 10/20/17 2222 16     Temp 10/20/17 2222 98.7 F (37.1 C)     Temp Source 10/20/17 2222 Oral     SpO2 10/20/17 2222 97 %     Weight 10/20/17 2223 152 lb (68.9 kg)     Height 10/20/17 2223 5\' 4"  (1.626 m)     Head Circumference --      Peak Flow --      Pain Score 10/20/17 2223 8     Pain Loc --      Pain Edu? --      Excl. in GC? --     Constitutional: Alert and oriented to self and location. Well appearing and in mild distress. Eyes: Conjunctivae are normal. PERRL. EOMI. Head: Atraumatic. Nose: No congestion/rhinnorhea. Mouth/Throat: Mucous membranes are moist.  Oropharynx non-erythematous. Cardiovascular: Normal rate, regular rhythm. Grossly normal heart sounds.  Good peripheral circulation. Respiratory: Normal respiratory effort.  No retractions. Lungs CTAB. Gastrointestinal: Soft and nontender. No distention.  Positive bowel sounds. Rectal: Stool ball in rectal vault Musculoskeletal: No lower  extremity tenderness nor edema.   Neurologic:  Normal speech and language.  Skin:  Skin is warm, dry and intact.  Psychiatric: Mood and affect are normal.   ____________________________________________   LABS (all labs ordered are listed, but only abnormal results are displayed)  Labs Reviewed  CBC - Abnormal; Notable for the following components:      Result Value   WBC 13.6 (*)    RDW 14.8 (*)    All other components within normal limits  COMPREHENSIVE METABOLIC PANEL - Abnormal; Notable for the following components:   Sodium 131 (*)    Potassium 3.1 (*)    Chloride 92 (*)    Glucose, Bld 123 (*)    BUN 24 (*)  Creatinine, Ser 1.21 (*)    ALT 10 (*)    GFR calc non Af Amer 40 (*)    GFR calc Af Amer 46 (*)    All other components within normal limits   ____________________________________________  EKG  none ____________________________________________  RADIOLOGY  ED MD interpretation:  KUB: Increased colonic stool burden compatible with constipation  CT abdomen and pelvis: Significant stool retention throughout the colon consistent with constipation, mild degree of circumferential mural thickening noted in the distal sigmoid and rectum consistent with stercoral proctocolitis  Official radiology report(s): Dg Abdomen 1 View  Result Date: 10/20/2017 CLINICAL DATA:  Constipation EXAM: ABDOMEN - 1 VIEW COMPARISON:  None. FINDINGS: Significant stool retention is noted along the right colon from cecum through splenic flexure as well as in the rectum. No significant small bowel dilatation. There is no free air. Mild elevation the right hemidiaphragm. No radio-opaque calculi. Degenerative changes are seen about both hips and lower lumbar spine. IMPRESSION: Increased colonic stool burden compatible with constipation. Electronically Signed   By: Tollie Eth M.D.   On: 10/20/2017 23:24   Ct Abdomen Pelvis W Contrast  Result Date: 10/21/2017 CLINICAL DATA:  Constipation EXAM:  CT ABDOMEN AND PELVIS WITH CONTRAST TECHNIQUE: Multidetector CT imaging of the abdomen and pelvis was performed using the standard protocol following bolus administration of intravenous contrast. CONTRAST:  75mL ISOVUE-300 IOPAMIDOL (ISOVUE-300) INJECTION 61% COMPARISON:  None. FINDINGS: Lower chest: Borderline cardiomegaly. Fine reticular subpleural markings at the lung bases compatible with interstitial fibrosis. Hepatobiliary: Homogeneous appearance of the liver. Motion artifacts limit assessment of the gallbladder. No definite stones. No biliary dilatation. Pancreas: Fatty atrophy of the pancreas.  No discrete mass. Spleen: Normal size spleen without mass. Adrenals/Urinary Tract: Normal bilateral adrenal glands. Motion artifacts limit assessment of the kidneys. No renal enhancing mass lesions. No definite nephrolithiasis nor obstructive uropathy. The urinary bladder is unremarkable for degree of distention. Stomach/Bowel: There is a significant amount of stool throughout the colon from cecum to rectum with circumferential mild thickening of the rectosigmoid and perirectal mild inflammation consistent with stercoral proctocolitis. The stomach and small intestine are unremarkable. Vascular/Lymphatic: Moderate aortoiliac atherosclerosis. No adenopathy. Reproductive: Status post hysterectomy. No adnexal masses. Other: No abdominal wall hernia or abnormality. No abdominopelvic ascites. Musculoskeletal: No acute or significant osseous findings. IMPRESSION: Significant stool retention throughout the colon consistent with constipation. Mild degree of circumferential mural thickening is noted of the distal sigmoid and rectum consistent with stercoral proctocolitis. Electronically Signed   By: Tollie Eth M.D.   On: 10/21/2017 01:15    ____________________________________________   PROCEDURES  Procedure(s) performed: None  Procedures  Critical Care performed:  No  ____________________________________________   INITIAL IMPRESSION / ASSESSMENT AND PLAN / ED COURSE  As part of my medical decision making, I reviewed the following data within the electronic MEDICAL RECORD NUMBER Notes from prior ED visits and [place] Controlled Substance Database   This is an 84 year old female who comes into the hospital today with constipation.  My differential diagnosis includes colitis, diverticulitis, volvulus.  The patient did have some firm stool in the rectal vault so I did have the nurse help to disimpact the patient.  She was able to remove some stool from the patient's vault but reports that this very hard and firm.  I did send the patient for a CT scan as she does have a white blood cell count of 13.6 with a sodium of 131.  The patient's CT does not show any volvulus but  she does have some significant stool burden.  The patient is comfortable with a soft abdomen and no vomiting at this time.  I feel that the patient can be managed in a nursing home with some MiraLAX and lactulose to help her have more bowel movements.  The patient will be discharged to follow-up with her primary care physician.  She should return with any fevers, nausea, vomiting or any other concerns.      ____________________________________________   FINAL CLINICAL IMPRESSION(S) / ED DIAGNOSES  Final diagnoses:  Lower abdominal pain  Constipation, unspecified constipation type     ED Discharge Orders        Ordered    polyethylene glycol (MIRALAX) packet  2 times daily     10/21/17 0252    lactulose (CHRONULAC) 10 GM/15ML solution  2 times daily     10/21/17 0252       Note:  This document was prepared using Dragon voice recognition software and may include unintentional dictation errors.    Rebecka ApleyWebster, Kashvi Prevette P, MD 10/21/17 [phone]

## 2019-01-01 DIAGNOSIS — E7801 Familial hypercholesterolemia: Secondary | ICD-10-CM | POA: Diagnosis not present

## 2019-01-01 DIAGNOSIS — E063 Autoimmune thyroiditis: Secondary | ICD-10-CM | POA: Diagnosis not present

## 2019-01-01 DIAGNOSIS — E039 Hypothyroidism, unspecified: Secondary | ICD-10-CM | POA: Diagnosis not present

## 2019-01-01 DIAGNOSIS — M8588 Other specified disorders of bone density and structure, other site: Secondary | ICD-10-CM | POA: Diagnosis not present

## 2019-01-01 DIAGNOSIS — I10 Essential (primary) hypertension: Secondary | ICD-10-CM | POA: Diagnosis not present

## 2019-01-01 DIAGNOSIS — F0391 Unspecified dementia with behavioral disturbance: Secondary | ICD-10-CM | POA: Diagnosis not present

## 2019-01-08 DIAGNOSIS — K581 Irritable bowel syndrome with constipation: Secondary | ICD-10-CM | POA: Diagnosis not present

## 2019-01-08 DIAGNOSIS — I10 Essential (primary) hypertension: Secondary | ICD-10-CM | POA: Diagnosis not present

## 2019-01-08 DIAGNOSIS — E039 Hypothyroidism, unspecified: Secondary | ICD-10-CM | POA: Diagnosis not present

## 2019-01-08 DIAGNOSIS — E7801 Familial hypercholesterolemia: Secondary | ICD-10-CM | POA: Diagnosis not present

## 2019-01-08 DIAGNOSIS — F4322 Adjustment disorder with anxiety: Secondary | ICD-10-CM | POA: Diagnosis not present

## 2019-01-08 DIAGNOSIS — F0391 Unspecified dementia with behavioral disturbance: Secondary | ICD-10-CM | POA: Diagnosis not present

## 2019-01-08 DIAGNOSIS — I7389 Other specified peripheral vascular diseases: Secondary | ICD-10-CM | POA: Diagnosis not present

## 2019-01-08 DIAGNOSIS — E063 Autoimmune thyroiditis: Secondary | ICD-10-CM | POA: Diagnosis not present

## 2019-01-08 DIAGNOSIS — M8588 Other specified disorders of bone density and structure, other site: Secondary | ICD-10-CM | POA: Diagnosis not present

## 2019-02-06 DIAGNOSIS — H02052 Trichiasis without entropian right lower eyelid: Secondary | ICD-10-CM | POA: Diagnosis not present

## 2019-02-11 DIAGNOSIS — Z0100 Encounter for examination of eyes and vision without abnormal findings: Secondary | ICD-10-CM | POA: Diagnosis not present

## 2019-03-24 DIAGNOSIS — G301 Alzheimer's disease with late onset: Secondary | ICD-10-CM | POA: Diagnosis not present

## 2019-03-24 DIAGNOSIS — F0281 Dementia in other diseases classified elsewhere with behavioral disturbance: Secondary | ICD-10-CM | POA: Diagnosis not present

## 2019-04-04 ENCOUNTER — Encounter (HOSPITAL_COMMUNITY): Payer: Self-pay

## 2019-04-04 ENCOUNTER — Encounter: Payer: Self-pay | Admitting: Emergency Medicine

## 2019-04-04 ENCOUNTER — Emergency Department
Admission: EM | Admit: 2019-04-04 | Discharge: 2019-04-04 | Disposition: A | Discharge status: Other Acute Inpatient Hospital | Payer: Medicare HMO | Attending: Emergency Medicine | Admitting: Emergency Medicine

## 2019-04-04 ENCOUNTER — Emergency Department: Payer: Medicare HMO

## 2019-04-04 ENCOUNTER — Inpatient Hospital Stay (HOSPITAL_COMMUNITY)
Admission: AD | Admit: 2019-04-04 | Discharge: 2019-04-07 | DRG: 177 | Disposition: A | Discharge status: Home Health | Payer: Medicare HMO | Source: Other Acute Inpatient Hospital | Attending: Internal Medicine | Admitting: Internal Medicine

## 2019-04-04 ENCOUNTER — Other Ambulatory Visit: Payer: Self-pay

## 2019-04-04 DIAGNOSIS — F0390 Unspecified dementia without behavioral disturbance: Secondary | ICD-10-CM | POA: Diagnosis present

## 2019-04-04 DIAGNOSIS — Z888 Allergy status to other drugs, medicaments and biological substances status: Secondary | ICD-10-CM

## 2019-04-04 DIAGNOSIS — Z7982 Long term (current) use of aspirin: Secondary | ICD-10-CM | POA: Diagnosis not present

## 2019-04-04 DIAGNOSIS — Z79899 Other long term (current) drug therapy: Secondary | ICD-10-CM | POA: Insufficient documentation

## 2019-04-04 DIAGNOSIS — J1289 Other viral pneumonia: Secondary | ICD-10-CM

## 2019-04-04 DIAGNOSIS — I10 Essential (primary) hypertension: Secondary | ICD-10-CM | POA: Diagnosis present

## 2019-04-04 DIAGNOSIS — U071 COVID-19: Secondary | ICD-10-CM | POA: Diagnosis not present

## 2019-04-04 DIAGNOSIS — M1990 Unspecified osteoarthritis, unspecified site: Secondary | ICD-10-CM | POA: Diagnosis not present

## 2019-04-04 DIAGNOSIS — I951 Orthostatic hypotension: Secondary | ICD-10-CM | POA: Diagnosis not present

## 2019-04-04 DIAGNOSIS — E860 Dehydration: Secondary | ICD-10-CM | POA: Diagnosis not present

## 2019-04-04 DIAGNOSIS — Z79890 Hormone replacement therapy (postmenopausal): Secondary | ICD-10-CM

## 2019-04-04 DIAGNOSIS — F419 Anxiety disorder, unspecified: Secondary | ICD-10-CM | POA: Diagnosis present

## 2019-04-04 DIAGNOSIS — N179 Acute kidney failure, unspecified: Secondary | ICD-10-CM | POA: Diagnosis not present

## 2019-04-04 DIAGNOSIS — N289 Disorder of kidney and ureter, unspecified: Secondary | ICD-10-CM | POA: Diagnosis not present

## 2019-04-04 DIAGNOSIS — R531 Weakness: Secondary | ICD-10-CM

## 2019-04-04 DIAGNOSIS — E876 Hypokalemia: Secondary | ICD-10-CM | POA: Diagnosis not present

## 2019-04-04 DIAGNOSIS — J1282 Pneumonia due to coronavirus disease 2019: Secondary | ICD-10-CM | POA: Diagnosis present

## 2019-04-04 DIAGNOSIS — E039 Hypothyroidism, unspecified: Secondary | ICD-10-CM | POA: Diagnosis not present

## 2019-04-04 HISTORY — DX: Chronic kidney disease, unspecified: N18.9

## 2019-04-04 HISTORY — DX: Mixed hyperlipidemia: E78.2

## 2019-04-04 HISTORY — DX: Depression, unspecified: F32.A

## 2019-04-04 HISTORY — DX: Atherosclerotic heart disease of native coronary artery without angina pectoris: I25.10

## 2019-04-04 HISTORY — DX: Unspecified glaucoma: H40.9

## 2019-04-04 LAB — COMPREHENSIVE METABOLIC PANEL
ALT: 11 U/L (ref 0–44)
AST: 24 U/L (ref 15–41)
Albumin: 3.4 g/dL — ABNORMAL LOW (ref 3.5–5.0)
Alkaline Phosphatase: 51 U/L (ref 38–126)
Anion gap: 13 (ref 5–15)
BUN: 35 mg/dL — ABNORMAL HIGH (ref 8–23)
CO2: 26 mmol/L (ref 22–32)
Calcium: 8.4 mg/dL — ABNORMAL LOW (ref 8.9–10.3)
Chloride: 96 mmol/L — ABNORMAL LOW (ref 98–111)
Creatinine, Ser: 1.91 mg/dL — ABNORMAL HIGH (ref 0.44–1.00)
GFR calc Af Amer: 27 mL/min — ABNORMAL LOW (ref >60)
GFR calc non Af Amer: 23 mL/min — ABNORMAL LOW (ref >60)
Glucose, Bld: 99 mg/dL (ref 70–99)
Potassium: 3.7 mmol/L (ref 3.5–5.1)
Sodium: 135 mmol/L (ref 135–145)
Total Bilirubin: 0.9 mg/dL (ref 0.3–1.2)
Total Protein: 6.9 g/dL (ref 6.5–8.1)

## 2019-04-04 LAB — URINALYSIS, ROUTINE W REFLEX MICROSCOPIC
Bacteria, UA: NONE SEEN
Bilirubin Urine: NEGATIVE
Glucose, UA: NEGATIVE mg/dL
Ketones, ur: NEGATIVE mg/dL
Leukocytes,Ua: NEGATIVE
Nitrite: NEGATIVE
Protein, ur: NEGATIVE mg/dL
Specific Gravity, Urine: 1.011 (ref 1.005–1.030)
pH: 6 (ref 5.0–8.0)

## 2019-04-04 LAB — CBC WITH DIFFERENTIAL/PLATELET
Abs Immature Granulocytes: 0.03 10*3/uL (ref 0.00–0.07)
Basophils Absolute: 0 10*3/uL (ref 0.0–0.1)
Basophils Relative: 0 %
Eosinophils Absolute: 0 10*3/uL (ref 0.0–0.5)
Eosinophils Relative: 1 %
HCT: 33.2 % — ABNORMAL LOW (ref 36.0–46.0)
Hemoglobin: 11 g/dL — ABNORMAL LOW (ref 12.0–15.0)
Immature Granulocytes: 1 %
Lymphocytes Relative: 30 %
Lymphs Abs: 1.9 10*3/uL (ref 0.7–4.0)
MCH: 27.6 pg (ref 26.0–34.0)
MCHC: 33.1 g/dL (ref 30.0–36.0)
MCV: 83.2 fL (ref 80.0–100.0)
Monocytes Absolute: 0.8 10*3/uL (ref 0.1–1.0)
Monocytes Relative: 12 %
Neutro Abs: 3.6 10*3/uL (ref 1.7–7.7)
Neutrophils Relative %: 56 %
Platelets: 226 10*3/uL (ref 150–400)
RBC: 3.99 MIL/uL (ref 3.87–5.11)
RDW: 13.2 % (ref 11.5–15.5)
WBC: 6.4 10*3/uL (ref 4.0–10.5)
nRBC: 0 % (ref 0.0–0.2)

## 2019-04-04 LAB — C-REACTIVE PROTEIN
CRP: 7.2 mg/dL — ABNORMAL HIGH (ref <1.0)
CRP: 7.8 mg/dL — ABNORMAL HIGH (ref <1.0)

## 2019-04-04 LAB — ABO/RH: ABO/RH(D): A POS

## 2019-04-04 LAB — MAGNESIUM: Magnesium: 1.8 mg/dL (ref 1.7–2.4)

## 2019-04-04 LAB — FIBRIN DERIVATIVES D-DIMER (ARMC ONLY): Fibrin derivatives D-dimer (ARMC): 1185 ng/mL (FEU) — ABNORMAL HIGH (ref 0.00–499.00)

## 2019-04-04 LAB — SARS CORONAVIRUS 2 BY RT PCR (HOSPITAL ORDER, PERFORMED IN ~~LOC~~ HOSPITAL LAB): SARS Coronavirus 2: POSITIVE — AB

## 2019-04-04 LAB — PROCALCITONIN: Procalcitonin: <0.1 ng/mL

## 2019-04-04 LAB — FERRITIN: Ferritin: 330 ng/mL — ABNORMAL HIGH (ref 11–307)

## 2019-04-04 LAB — TSH: TSH: 0.355 u[IU]/mL (ref 0.350–4.500)

## 2019-04-04 LAB — TROPONIN I (HIGH SENSITIVITY)
Troponin I (High Sensitivity): 12 ng/L (ref <18)
Troponin I (High Sensitivity): 5 ng/L (ref <18)

## 2019-04-04 LAB — T4, FREE: Free T4: 1.35 ng/dL — ABNORMAL HIGH (ref 0.61–1.12)

## 2019-04-04 LAB — LACTATE DEHYDROGENASE: LDH: 164 U/L (ref 98–192)

## 2019-04-04 MED ORDER — LACTULOSE 10 GM/15ML PO SOLN: 10.0000 g | Freq: Two times a day (BID) | ORAL | Status: DC

## 2019-04-04 MED ORDER — HYDROCOD POLST-CPM POLST ER 10-8 MG/5ML PO SUER: 5.0000 mL | Freq: Two times a day (BID) | ORAL | Status: DC | PRN

## 2019-04-04 MED ORDER — LACTATED RINGERS IV SOLN
INTRAVENOUS | Status: DC
  Administered 2019-04-04: 17:00:00 via INTRAVENOUS

## 2019-04-04 MED ORDER — MIRTAZAPINE 15 MG PO TABS
7.5000 mg | ORAL_TABLET | Freq: Every day | ORAL | Status: DC
  Administered 2019-04-04 – 2019-04-06 (×3): 7.5 mg via ORAL
  Filled 2019-04-04 (×3): qty 1

## 2019-04-04 MED ORDER — SODIUM CHLORIDE 0.9 % IV SOLN: INTRAVENOUS | Status: DC

## 2019-04-04 MED ORDER — ALBUTEROL SULFATE HFA 108 (90 BASE) MCG/ACT IN AERS: 2.0000 | INHALATION_SPRAY | Freq: Four times a day (QID) | RESPIRATORY_TRACT | Status: DC

## 2019-04-04 MED ORDER — DEXAMETHASONE 6 MG PO TABS
6.0000 mg | ORAL_TABLET | ORAL | Status: DC
  Administered 2019-04-04 – 2019-04-06 (×3): 6 mg via ORAL
  Filled 2019-04-04 (×3): qty 1

## 2019-04-04 MED ORDER — ENOXAPARIN SODIUM 30 MG/0.3ML ~~LOC~~ SOLN
30.0000 mg | SUBCUTANEOUS | Status: DC
  Administered 2019-04-04 – 2019-04-06 (×3): 30 mg via SUBCUTANEOUS
  Filled 2019-04-04 (×3): qty 0.3

## 2019-04-04 MED ORDER — OXYBUTYNIN CHLORIDE ER 5 MG PO TB24
5.0000 mg | ORAL_TABLET | Freq: Every day | ORAL | Status: DC
  Administered 2019-04-04 – 2019-04-06 (×3): 5 mg via ORAL
  Filled 2019-04-04 (×4): qty 1

## 2019-04-04 MED ORDER — LATANOPROST 0.005 % OP SOLN
1.0000 [drp] | Freq: Every day | OPHTHALMIC | Status: DC
  Administered 2019-04-04 – 2019-04-06 (×3): 1 [drp] via OPHTHALMIC
  Filled 2019-04-04: qty 2.5

## 2019-04-04 MED ORDER — LEVOTHYROXINE SODIUM 50 MCG PO TABS
100.0000 ug | ORAL_TABLET | Freq: Every day | ORAL | Status: DC
  Administered 2019-04-05 – 2019-04-07 (×3): 100 ug via ORAL
  Filled 2019-04-04 (×3): qty 2

## 2019-04-04 MED ORDER — LACTATED RINGERS IV SOLN
INTRAVENOUS | Status: DC
  Administered 2019-04-04 – 2019-04-05 (×2): via INTRAVENOUS

## 2019-04-04 MED ORDER — ACETAMINOPHEN 325 MG PO TABS: 650.0000 mg | ORAL_TABLET | Freq: Four times a day (QID) | ORAL | Status: DC | PRN

## 2019-04-04 MED ORDER — BRIMONIDINE TARTRATE 0.2 % OP SOLN
1.0000 [drp] | Freq: Two times a day (BID) | OPHTHALMIC | Status: DC
  Administered 2019-04-04 – 2019-04-07 (×6): 1 [drp] via OPHTHALMIC
  Filled 2019-04-04: qty 5

## 2019-04-04 MED ORDER — ISOSORBIDE MONONITRATE ER 30 MG PO TB24
30.0000 mg | ORAL_TABLET | Freq: Every day | ORAL | Status: DC
  Administered 2019-04-05 – 2019-04-07 (×3): 30 mg via ORAL
  Filled 2019-04-04 (×3): qty 1

## 2019-04-04 MED ORDER — DOCUSATE SODIUM 100 MG PO CAPS: 100.0000 mg | ORAL_CAPSULE | Freq: Every day | ORAL | Status: DC | PRN

## 2019-04-04 MED ORDER — ONDANSETRON HCL 4 MG PO TABS: 4.0000 mg | ORAL_TABLET | Freq: Four times a day (QID) | ORAL | Status: DC | PRN

## 2019-04-04 MED ORDER — SODIUM CHLORIDE 0.9 % IV BOLUS
1000.0000 mL | Freq: Once | INTRAVENOUS | Status: AC
  Administered 2019-04-04: 14:00:00 1000 mL via INTRAVENOUS

## 2019-04-04 MED ORDER — SERTRALINE HCL 50 MG PO TABS
200.0000 mg | ORAL_TABLET | Freq: Every day | ORAL | Status: DC
  Administered 2019-04-05 – 2019-04-07 (×3): 200 mg via ORAL
  Filled 2019-04-04 (×3): qty 4

## 2019-04-04 MED ORDER — TRAZODONE HCL 50 MG PO TABS
50.0000 mg | ORAL_TABLET | Freq: Every day | ORAL | Status: DC
  Administered 2019-04-04 – 2019-04-06 (×3): 50 mg via ORAL
  Filled 2019-04-04 (×3): qty 1

## 2019-04-04 MED ORDER — ASPIRIN 81 MG PO CHEW
81.0000 mg | CHEWABLE_TABLET | Freq: Every day | ORAL | Status: DC
  Administered 2019-04-05 – 2019-04-07 (×3): 81 mg via ORAL
  Filled 2019-04-04 (×3): qty 1

## 2019-04-04 MED ORDER — BRINZOLAMIDE 1 % OP SUSP
1.0000 [drp] | Freq: Two times a day (BID) | OPHTHALMIC | Status: DC
  Administered 2019-04-04 – 2019-04-07 (×6): 1 [drp] via OPHTHALMIC
  Filled 2019-04-04: qty 10

## 2019-04-04 MED ORDER — LAMOTRIGINE 25 MG PO TABS
50.0000 mg | ORAL_TABLET | Freq: Two times a day (BID) | ORAL | Status: DC
  Administered 2019-04-04 – 2019-04-07 (×6): 50 mg via ORAL
  Filled 2019-04-04 (×8): qty 2

## 2019-04-04 MED ORDER — GUAIFENESIN-DM 100-10 MG/5ML PO SYRP: 10.0000 mL | ORAL_SOLUTION | ORAL | Status: DC | PRN

## 2019-04-04 MED ORDER — DEXAMETHASONE 4 MG PO TABS
6.0000 mg | ORAL_TABLET | Freq: Every day | ORAL | Status: DC
  Filled 2019-04-04: qty 1.5

## 2019-04-04 MED ORDER — DONEPEZIL HCL 10 MG PO TABS
10.0000 mg | ORAL_TABLET | Freq: Every day | ORAL | Status: DC
  Administered 2019-04-04 – 2019-04-06 (×3): 10 mg via ORAL
  Filled 2019-04-04 (×3): qty 1

## 2019-04-04 MED ORDER — VITAMIN D3 25 MCG (1000 UNIT) PO TABS
2000.0000 [IU] | ORAL_TABLET | Freq: Every day | ORAL | Status: DC
  Administered 2019-04-05 – 2019-04-07 (×3): 2000 [IU] via ORAL
  Filled 2019-04-04 (×6): qty 2

## 2019-04-04 MED ORDER — ONDANSETRON HCL 4 MG/2ML IJ SOLN: 4.0000 mg | Freq: Four times a day (QID) | INTRAMUSCULAR | Status: DC | PRN

## 2019-04-04 NOTE — ED Provider Notes (Signed)
-----------------------------------------   4:27 PM on 04/04/2019 -----------------------------------------  Patient's labs have resulted positive for coronavirus.  Given the patient's increased generalized weakness being too weak to get out of bed since last night along with acute renal insufficiency I believe the patient would benefit from admission for further treatment.  As the patient is COVID positive we will admit to [facility].  I discussed patient with Dr. Candiss Norse at [facility] who has accepted the patient to his service.  Requesting 6 mg of oral Decadron be administered and we will start the patient on LR at 75 mL's per hour.  He states he will order the additional blood work that he would like and we will have the nurse draw it.   Harvest Dark, MD 04/04/19 [phone]

## 2019-04-04 NOTE — H&P (Signed)
History and Physical    Kelly Sullivan TDV:[id number] DOB: June 23, 1931 DOA: 04/04/2019  PCP: Lynnell Jude, MD  Patient coming from: Home, [facility]  I have personally briefly reviewed patient's old medical records in [place]  Chief Complaint: Generalized weakness  HPI: Kelly Sullivan is a 85 y.o. female with medical history significant of mild dementia, HTN, hypothyroidism.  Patient lives at home and performs most ADLs at baseline.  Patient has had generalized weakness for past 2 days.  OT came to house to work with patient, found her hypotensive and patient sent into [place] [facility].  Had been on cipro for UTI.  No dysuria.  Had near syncope with standing with OT today.  Admits to poor PO intake.  No cough no SOB.   ED Course: Found to have PNA on CXR, COVID positive.  WBC 6.4.  Tm 99.5, no O2 requirement.  Transferred to [facility] for admission.   Review of Systems: As per HPI, otherwise all review of systems negative.  Past Medical History:  Diagnosis Date  . Anxiety   . Arthritis   . Dementia ([place])   . Hypertension   . Thyroid disease     Past Surgical History:  Procedure Laterality Date  . ABDOMINAL HYSTERECTOMY    . CARDIAC CATHETERIZATION Left 06/06/2016   Procedure: Left Heart Cath and Coronary Angiography;  Surgeon: Corey Skains, MD;  Location: [place] CV LAB;  Service: Cardiovascular;  Laterality: Left;     reports that she has never smoked. She does not have any smokeless tobacco history on file. She reports that she does not drink alcohol. No history on file for drug.  Allergies  Allergen Reactions  . Sudafed [Pseudoephedrine Hcl] Hives    No family history on file. No reported sick contacts.  Prior to Admission medications   Medication Sig Start Date End Date Taking? Authorizing Provider  acetaminophen (TYLENOL) 500 MG tablet Take 1,000 mg by mouth 2 (two) times daily. Pt also takes one tablet every six hours as needed for pain.     [provider]  aspirin 81 MG chewable tablet Chew 81 mg by mouth daily.    [provider]  azelastine (ASTELIN) 0.1 % nasal spray Place 2 sprays into both nostrils 2 (two) times daily. Use in each nostril as directed    [provider]  Brinzolamide-Brimonidine (SIMBRINZA) 1-0.2 % SUSP Place 1 drop into the right eye 2 (two) times daily.    [provider]  cholecalciferol (VITAMIN D) 1000 units tablet Take 2,000 Units by mouth daily.    [provider]  docusate sodium (COLACE) 100 MG capsule Take 100 mg by mouth daily.    [provider]  donepezil (ARICEPT) 5 MG tablet Take 5 mg by mouth at bedtime.    [provider]  lactulose (CHRONULAC) 10 GM/15ML solution Take 15 mLs (10 g total) by mouth 2 (two) times daily. 10/21/17   Loney Hering, MD  latanoprost (XALATAN) 0.005 % ophthalmic solution Place 1 drop into the right eye at bedtime.    [provider]  levothyroxine (SYNTHROID, LEVOTHROID) 100 MCG tablet Take 100 mcg by mouth daily before breakfast.    [provider]  loratadine (CLARITIN) 10 MG tablet Take 10 mg by mouth daily.    [provider]  losartan-hydrochlorothiazide (HYZAAR) 100-25 MG tablet Take 1 tablet by mouth daily.    [provider]  Omega-3 Fatty Acids (FISH OIL) 1200 MG CAPS Take  1,200 mg by mouth daily.    [provider]  polyethylene glycol (MIRALAX) packet Take 17 g by mouth 2 (two) times daily. 10/21/17   Rebecka Apley, MD  polyvinyl alcohol (LIQUIFILM TEARS) 1.4 % ophthalmic solution Place 1 drop into both eyes 4 (four) times daily.    [provider]  PRESCRIPTION MEDICATION Place 1 application into both ears 2 (two) times daily as needed (itching). Mometasone furoate 0.1% solution    [provider]  sertraline (ZOLOFT) 100 MG tablet Take 200 mg by mouth daily.     [provider]    Physical Exam: Vitals:   04/04/19  1847  BP: (!) 147/62  Resp: 18  Temp: 98.9 F (37.2 C)  TempSrc: Oral  SpO2: 98%  Weight: 65 kg  Height: 5\' 4"  (1.626 m)    Constitutional: NAD, calm, comfortable Eyes: PERRL, lids and conjunctivae normal ENMT: Mucous membranes are moist. Posterior pharynx clear of any exudate or lesions.Normal dentition.  Neck: normal, supple, no masses, no thyromegaly Respiratory: clear to auscultation bilaterally, no wheezing, no crackles. Normal respiratory effort. No accessory muscle use.  Cardiovascular: Regular rate and rhythm, no murmurs / rubs / gallops. No extremity edema. 2+ pedal pulses. No carotid bruits.  Abdomen: no tenderness, no masses palpated. No hepatosplenomegaly. Bowel sounds positive.  Musculoskeletal: no clubbing / cyanosis. No joint deformity upper and lower extremities. Good ROM, no contractures. Normal muscle tone.  Skin: no rashes, lesions, ulcers. No induration Neurologic: CN 2-12 grossly intact. Sensation intact, DTR normal. Strength 5/5 in all 4.  Psychiatric: Normal judgment and insight. Alert and oriented x 3. Normal mood.    Labs on Admission: I have personally reviewed following labs and imaging studies  CBC: Recent Labs  Lab 04/04/19 1359  WBC 6.4  NEUTROABS 3.6  HGB 11.0*  HCT 33.2*  MCV 83.2  PLT 226   Basic Metabolic Panel: Recent Labs  Lab 04/04/19 1359  NA 135  K 3.7  CL 96*  CO2 26  GLUCOSE 99  BUN 35*  CREATININE 1.91*  CALCIUM 8.4*  MG 1.8   GFR: Estimated Creatinine Clearance: 18.3 mL/min (A) (by C-G formula based on SCr of 1.91 mg/dL (H)). Liver Function Tests: Recent Labs  Lab 04/04/19 1359  AST 24  ALT 11  ALKPHOS 51  BILITOT 0.9  PROT 6.9  ALBUMIN 3.4*   No results for input(s): LIPASE, AMYLASE in the last 168 hours. No results for input(s): AMMONIA in the last 168 hours. Coagulation Profile: No results for input(s): INR, PROTIME in the last 168 hours. Cardiac Enzymes: No results for input(s): CKTOTAL, CKMB,  CKMBINDEX, TROPONINI in the last 168 hours. BNP (last 3 results) No results for input(s): PROBNP in the last 8760 hours. HbA1C: No results for input(s): HGBA1C in the last 72 hours. CBG: No results for input(s): GLUCAP in the last 168 hours. Lipid Profile: No results for input(s): CHOL, HDL, LDLCALC, TRIG, CHOLHDL, LDLDIRECT in the last 72 hours. Thyroid Function Tests: Recent Labs    04/04/19 1359  TSH 0.355  FREET4 1.35*   Anemia Panel: Recent Labs    04/04/19 1658  FERRITIN 330*   Urine analysis:    Component Value Date/Time   COLORURINE YELLOW (A) 04/04/2019 1550   APPEARANCEUR HAZY (A) 04/04/2019 1550   APPEARANCEUR Clear 10/09/2013 0900   LABSPEC 1.011 04/04/2019 1550   LABSPEC 1.021 10/09/2013 0900   PHURINE 6.0 04/04/2019 1550   GLUCOSEU NEGATIVE 04/04/2019 1550   GLUCOSEU Negative 10/09/2013  0900   HGBUR SMALL (A) 04/04/2019 1550   BILIRUBINUR NEGATIVE 04/04/2019 1550   BILIRUBINUR Negative 10/09/2013 0900   KETONESUR NEGATIVE 04/04/2019 1550   PROTEINUR NEGATIVE 04/04/2019 1550   NITRITE NEGATIVE 04/04/2019 1550   LEUKOCYTESUR NEGATIVE 04/04/2019 1550   LEUKOCYTESUR 1+ 10/09/2013 0900    Radiological Exams on Admission: Dg Chest Portable 1 View  Result Date: 04/04/2019 CLINICAL DATA:  Shortness of breath, weakness EXAM: PORTABLE CHEST 1 VIEW COMPARISON:  12/13/2015 FINDINGS: Cardiac silhouette is upper limits of normal in size, unchanged. Calcific aortic knob. Patchy right upper lobe airspace opacity. Mild streaky bibasilar opacities. No pleural effusion or pneumothorax. IMPRESSION: 1. Patchy right upper lobe airspace opacity suspicious for developing pneumonia in the appropriate clinical setting. 2. Mild bibasilar opacities, favor atelectasis. Electronically Signed   By: Duanne GuessNicholas  Plundo M.D.   On: 04/04/2019 15:36    EKG: Independently reviewed.  Assessment/Plan Principal Problem:   Pneumonia due to COVID-19 virus Active Problems:   HTN (hypertension)    Dementia without behavioral disturbance (HCC)   Hypothyroidism   AKI (acute kidney injury) (HCC)    1. COVID PNA - 1. COVID pathway 2. No O2 requirement nor SOB currently, so not starting remdesivir nor actemra yet 3. Decadron 6mg  Q24H, looks like she wasn't administered dose in ED from what I can tell 4. Procalcitonin and CRP ordered 5. Daily labs 2. AKI - 1. Stop CIPRO, UA not showing UTI today 2. IVF: bolus in ED + LR at 75 cc/hr 3. Strict intake and output 4. Repeat CMP in AM 3. HTN - 1. Was orthostatic PTA to ED 2. Likely to hold home BP meds for the moment once med rec completed 3. Resume non ACE/ARB / non diruetic meds first when they are resumed 4. Hypothyroidism - 1. Continue synthroid once med rec completed 5. Dementia - mild 1. Cont home meds  DVT prophylaxis: Lovenox Code Status: Full Family Communication: No family in room Disposition Plan: Home after admit Consults called: None Admission status: Place in obs - likely to convert to IP tomorrow when not ready for discharge (because shes got COVID-19, AKI)  GARDNER, JARED M. DO [facility]  How to contact the [facility] Attending or Consulting provider 7A - 7P or covering provider during after hours 7P -7A, for this patient?  1. Check the care team in [facility] and look for a) attending/consulting [facility] provider listed and b) the [facility] team listed 2. Log into [email or web address]  Amion Physician Scheduling and messaging for groups and whole hospitals  On call and physician scheduling software for group practices, residents, hospitalists and other medical providers for call, clinic, rotation and shift schedules. On[employer] is a hospital-wide system for scheduling doctors and paging doctors on call. EasyPlot is for scientific plotting and data analysis.  [email or web address]  and use [place]'s universal password to access. If you do not have the password, please contact the hospital operator.  3. Locate the [facility] provider you  are looking for under [facility] and page to a number that you can be directly reached. 4. If you still have difficulty reaching the provider, please page the [facility] (Director on Call) for the Hospitalists listed on amion for assistance.  04/04/2019, 7:58 PM

## 2019-04-04 NOTE — ED Notes (Signed)
400 mL NS while in transport.

## 2019-04-04 NOTE — ED Triage Notes (Addendum)
Pt arrives from home via EMS. Pt states that she began to feel weak yesterday. OT came to visit her this morning for therapy and stated that she was too weak to get up and move. States that she had a syncopal episode while attempting to stand up.

## 2019-04-04 NOTE — ED Notes (Signed)
Attempted in & out, unable.

## 2019-04-04 NOTE — Progress Notes (Signed)
Updated on daughter on plan of care and arrival to [facility].

## 2019-04-04 NOTE — Discharge Instructions (Addendum)
Your workup was re-assuring. This was most likely from dehydration causing low blood pressure.  Return to ER for fevers, shortness of breath or any other concerns.

## 2019-04-04 NOTE — Progress Notes (Signed)
Kelly Sullivan, is a 85 y.o. female, DOB - 1930-09-07, UDJ:[id number]   With H/O mild Dementia, HTN, lives at home, felt weak x 2 days, OT came to work at home and found her hypotensive, [facility] - weak, AKI, No SOB or hypoxia. Admit Med bed, IVF, low dose steroids.     Vitals:   04/04/19 1400 04/04/19 1430 04/04/19 1500 04/04/19 1530  BP: 111/67 133/66 (!) 148/71 (!) 160/73  Pulse: 72 73 73 75  Resp: 15 19 16 17   Temp:      TempSrc:      SpO2: 97% 97% 93% 96%  Weight:      Height:            Data Review   Micro Results Recent Results (from the past 240 hour(s))  SARS Coronavirus 2 [facility] order, Performed in [facility] hospital lab) Nasopharyngeal Nasopharyngeal Swab     Status: Abnormal   Collection Time: 04/04/19  1:59 PM   Specimen: Nasopharyngeal Swab  Result Value Ref Range Status   SARS Coronavirus 2 POSITIVE (A) NEGATIVE Final    Comment: RESULT CALLED TO, READ BACK BY AND VERIFIED WITH: TERESA CLAPP AT 1545 04/04/2019.PMF (NOTE) If result is NEGATIVE SARS-CoV-2 target nucleic acids are NOT DETECTED. The SARS-CoV-2 RNA is generally detectable in upper and lower  respiratory specimens during the acute phase of infection. The lowest  concentration of SARS-CoV-2 viral copies this assay can detect is 250  copies / mL. A negative result does not preclude SARS-CoV-2 infection  and should not be used as the sole basis for treatment or other  patient management decisions.  A negative result may occur with  improper specimen collection / handling, submission of specimen other  than nasopharyngeal swab, presence of viral mutation(s) within the  areas targeted by this assay, and inadequate number of viral copies  (<250 copies / mL). A negative result must be combined with clinical  observations, patient history, and epidemiological information. If result is POSITIVE SARS-CoV-2 target  nucleic acids are DETECTED. The  SARS-CoV-2 RNA is generally detectable in upper and lower  respiratory specimens during the acute phase of infection.  Positive  results are indicative of active infection with SARS-CoV-2.  Clinical  correlation with patient history and other diagnostic information is  necessary to determine patient infection status.  Positive results do  not rule out bacterial infection or co-infection with other viruses. If result is PRESUMPTIVE POSTIVE SARS-CoV-2 nucleic acids MAY BE PRESENT.   A presumptive positive result was obtained on the submitted specimen  and confirmed on repeat testing.  While 2019 novel coronavirus  (SARS-CoV-2) nucleic acids may be present in the submitted sample  additional confirmatory testing may be necessary for epidemiological  and / or clinical management purposes  to differentiate between  SARS-CoV-2 and other Sarbecovirus currently known to infect humans.  If clinically indicated additional testing with an alternate test  methodology [phone]) is a dvised. The SARS-CoV-2 RNA is generally  detectable in upper and lower respiratory specimens during the acute  phase of infection. The expected result is Negative. Fact Sheet for Patients:  StrictlyIdeas.no Fact Sheet for Healthcare Providers: BankingDealers.co.za This test is not yet approved or cleared by the [place] FDA and has been authorized for detection and/or diagnosis of SARS-CoV-2 by FDA under an Emergency Use Authorization (EUA).  This EUA will remain in effect (meaning this test can be used) for the duration of the COVID-19 declaration under Section 564(b)(1) of the  Act, 21 U.S.C. section 360bbb-3(b)(1), unless the authorization is terminated or revoked sooner. Performed at [facility], [address] [place], [place] [address]     Radiology Reports Dg Chest Portable 1 View  Result Date: 04/04/2019  CLINICAL DATA:  Shortness of breath, weakness EXAM: PORTABLE CHEST 1 VIEW COMPARISON:  12/13/2015 FINDINGS: Cardiac silhouette is upper limits of normal in size, unchanged. Calcific aortic knob. Patchy right upper lobe airspace opacity. Mild streaky bibasilar opacities. No pleural effusion or pneumothorax. IMPRESSION: 1. Patchy right upper lobe airspace opacity suspicious for developing pneumonia in the appropriate clinical setting. 2. Mild bibasilar opacities, favor atelectasis. Electronically Signed   By: Duanne GuessNicholas  Plundo M.D.   On: 04/04/2019 15:36    CBC Recent Labs  Lab 04/04/19 1359  WBC 6.4  HGB 11.0*  HCT 33.2*  PLT 226  MCV 83.2  MCH 27.6  MCHC 33.1  RDW 13.2  LYMPHSABS 1.9  MONOABS 0.8  EOSABS 0.0  BASOSABS 0.0    Chemistries  Recent Labs  Lab 04/04/19 1359  NA 135  K 3.7  CL 96*  CO2 26  GLUCOSE 99  BUN 35*  CREATININE 1.91*  CALCIUM 8.4*  MG 1.8  AST 24  ALT 11  ALKPHOS 51  BILITOT 0.9   ------------------------------------------------------------------------------------------------------------------ estimated creatinine clearance is 20.2 mL/min (A) (by C-G formula based on SCr of 1.91 mg/dL (H)). ------------------------------------------------------------------------------------------------------------------ No results for input(s): HGBA1C in the last 72 hours. ------------------------------------------------------------------------------------------------------------------ No results for input(s): CHOL, HDL, LDLCALC, TRIG, CHOLHDL, LDLDIRECT in the last 72 hours. ------------------------------------------------------------------------------------------------------------------ Recent Labs    04/04/19 1359  TSH 0.355   ------------------------------------------------------------------------------------------------------------------ No results for input(s): VITAMINB12, FOLATE, FERRITIN, TIBC, IRON, RETICCTPCT in the last 72 hours.  Coagulation  profile No results for input(s): INR, PROTIME in the last 168 hours.  No results for input(s): DDIMER in the last 72 hours.  Cardiac Enzymes No results for input(s): CKMB, TROPONINI, MYOGLOBIN in the last 168 hours.  Invalid input(s): CK ------------------------------------------------------------------------------------------------------------------ Invalid input(s): POCBNP

## 2019-04-04 NOTE — ED Notes (Signed)
Pt D/C into the care of [facility] to be transferred to [place]. Pt in stable condition at time of transfer. Pt's daughter aware of transfer at this time. [facility] RN made aware patient en route.

## 2019-04-04 NOTE — ED Provider Notes (Signed)
[facility] Emergency Department Provider Note  ____________________________________________   First MD Initiated Contact with Patient 04/04/19 1356     (approximate)  I have reviewed the triage vital signs and the nursing notes.   HISTORY  Chief Complaint Weakness    HPI Kelly Sullivan is a 85 y.o. female with hypertension, thyroid disease, dementia who presents with weakness.  OT came to evaluate patient this morning and says that she had a near syncopal event when attempting to stand up. They noted low blood pressures.  Patient says that she is currently getting treated for a UTI.  She is unsure which antibiotic.  She denies any urinary symptoms just that her urine was orange color.  She has been feeling generalized weakness and not been eating as well although still try to drink water. No bad pain.   I discussed with patient's daughter who patient lives with.  Patient has been on ciprofloxacin. She has been tolerating meds but just not eating or drinking as well as usual.           Past Medical History:  Diagnosis Date  . Anxiety   . Arthritis   . Dementia (HCC)   . Hypertension   . Thyroid disease     Patient Active Problem List   Diagnosis Date Noted  . Stable angina (HCC) 06/06/2016    Past Surgical History:  Procedure Laterality Date  . ABDOMINAL HYSTERECTOMY    . CARDIAC CATHETERIZATION Left 06/06/2016   Procedure: Left Heart Cath and Coronary Angiography;  Surgeon: Lamar BlinksBruce J Kowalski, MD;  Location: [facility] INVASIVE CV LAB;  Service: Cardiovascular;  Laterality: Left;    Prior to Admission medications   Medication Sig Start Date End Date Taking? Authorizing Provider  acetaminophen (TYLENOL) 500 MG tablet Take 1,000 mg by mouth 2 (two) times daily. Pt also takes one tablet every six hours as needed for pain.    [provider]  aspirin 81 MG chewable tablet Chew 81 mg by mouth daily.    [provider]  azelastine  (ASTELIN) 0.1 % nasal spray Place 2 sprays into both nostrils 2 (two) times daily. Use in each nostril as directed    [provider]  Brinzolamide-Brimonidine (SIMBRINZA) 1-0.2 % SUSP Place 1 drop into the right eye 2 (two) times daily.    [provider]  cholecalciferol (VITAMIN D) 1000 units tablet Take 2,000 Units by mouth daily.    [provider]  docusate sodium (COLACE) 100 MG capsule Take 100 mg by mouth daily.    [provider]  donepezil (ARICEPT) 5 MG tablet Take 5 mg by mouth at bedtime.    [provider]  lactulose (CHRONULAC) 10 GM/15ML solution Take 15 mLs (10 g total) by mouth 2 (two) times daily. 10/21/17   Rebecka ApleyWebster, Allison P, MD  latanoprost (XALATAN) 0.005 % ophthalmic solution Place 1 drop into the right eye at bedtime.    [provider]  levothyroxine (SYNTHROID, LEVOTHROID) 100 MCG tablet Take 100 mcg by mouth daily before breakfast.    [provider]  loratadine (CLARITIN) 10 MG tablet Take 10 mg by mouth daily.    [provider]  losartan-hydrochlorothiazide (HYZAAR) 100-25 MG tablet Take 1 tablet by mouth daily.    [provider]  Omega-3 Fatty Acids (FISH OIL) 1200 MG CAPS Take 1,200 mg by mouth daily.    [provider]  polyethylene glycol (MIRALAX) packet Take 17 g by mouth 2 (two) times  daily. 10/21/17   Loney Hering, MD  polyvinyl alcohol (LIQUIFILM TEARS) 1.4 % ophthalmic solution Place 1 drop into both eyes 4 (four) times daily.    [provider]  PRESCRIPTION MEDICATION Place 1 application into both ears 2 (two) times daily as needed (itching). Mometasone furoate 0.1% solution    [provider]  sertraline (ZOLOFT) 100 MG tablet Take 200 mg by mouth daily.     [provider]    Allergies Sudafed [pseudoephedrine hcl]  No family history on file.  Social History Social History   Tobacco Use  . Smoking status: Never Smoker   Substance Use Topics  . Alcohol use: No  . Drug use: Not on file      Review of Systems Constitutional: No fever/chills, + weakness  Eyes: No visual changes. ENT: No sore throat. Cardiovascular: Denies chest pain. Respiratory: Denies shortness of breath. Gastrointestinal: No abdominal pain.  No nausea, no vomiting.  No diarrhea.  No constipation. Genitourinary: Negative for dysuria. Musculoskeletal: Negative for back pain. Skin: Negative for rash. Neurological: Negative for headaches, focal weakness or numbness. All other ROS negative ____________________________________________   PHYSICAL EXAM:  VITAL SIGNS: ED Triage Vitals  Enc Vitals Group     BP 04/04/19 1347 120/60     Pulse Rate 04/04/19 1347 77     Resp 04/04/19 1347 16     Temp 04/04/19 1347 99.1 F (37.3 C)     Temp Source 04/04/19 1347 Oral     SpO2 04/04/19 1347 100 %     Weight 04/04/19 1348 152 lb (68.9 kg)     Height 04/04/19 1348 5\' 4"  (1.626 m)     Head Circumference --      Peak Flow --      Pain Score 04/04/19 1348 0     Pain Loc --      Pain Edu? --      Excl. in [place]? --     Constitutional: Alert and oriented. Well appearing and in no acute distress. Eyes: Conjunctivae are normal. EOMI. Head: Atraumatic. Nose: No congestion/rhinnorhea. Mouth/Throat: Mucous membranes are moist.   Neck: No stridor. Trachea Midline. FROM Cardiovascular: Normal rate, regular rhythm. Grossly normal heart sounds.  Good peripheral circulation. Respiratory: Normal respiratory effort.  No retractions. Lungs CTAB. Gastrointestinal: Soft and nontender. No distention. No abdominal bruits.  Musculoskeletal: No lower extremity tenderness nor edema.  No joint effusions. Neurologic:  Normal speech and language. No gross focal neurologic deficits are appreciated.  Skin:  Skin is warm, dry and intact. No rash noted. Psychiatric: Mood and affect are normal. Speech and behavior are normal. GU: Deferred    ____________________________________________   LABS (all labs ordered are listed, but only abnormal results are displayed)  Labs Reviewed  CBC WITH DIFFERENTIAL/PLATELET - Abnormal; Notable for the following components:      Result Value   Hemoglobin 11.0 (*)    HCT 33.2 (*)    All other components within normal limits  COMPREHENSIVE METABOLIC PANEL - Abnormal; Notable for the following components:   Chloride 96 (*)    BUN 35 (*)    Creatinine, Ser 1.91 (*)    Calcium 8.4 (*)    Albumin 3.4 (*)    GFR calc non Af Amer 23 (*)    GFR calc Af Amer 27 (*)    All other components within normal limits  SARS CORONAVIRUS 2 (HOSPITAL ORDER, [place] [facility])  MAGNESIUM  URINALYSIS, ROUTINE W REFLEX  MICROSCOPIC  TSH  T4, FREE  TROPONIN I (HIGH SENSITIVITY)   ____________________________________________   ED ECG REPORT I, Concha Se, the attending physician, personally viewed and interpreted this ECG.  EKG is normal sinus rate of 80, no ST elevation, no T wave inversion, normal intervals ____________________________________________    INITIAL IMPRESSION / ASSESSMENT AND PLAN / ED COURSE  Kelly Sullivan was evaluated in Emergency Department on 04/04/2019 for the symptoms described in the history of present illness. She was evaluated in the context of the global COVID-19 pandemic, which necessitated consideration that the patient might be at risk for infection with the SARS-CoV-2 virus that causes COVID-19. Institutional protocols and algorithms that pertain to the evaluation of patients at risk for COVID-19 are in a state of rapid change based on information released by regulatory bodies including the CDC and federal and state organizations. These policies and algorithms were followed during the patient's care in the ED.    Pt is well appearing 85 yr old who presents with near syncope and currently getting UTI rx.  No evidence of sepsis.  Most likely dehydration  causing orthostatic hypotension. +orthostatic vitals. Will get labs to evaluate for AKI, electrolyte abnormalities. No recent falls where she hit her head. Will test coronavirus.  Sats 93% but denies SOB will get xray to evaluate for PNA.  EKG to evaluate for arrhythmia.   Normal white count.  Trop12.  Pt handed off pending UA, repeat trop, thryoid studies, xray. If negative and pt feels better after fluid consider d/c home vs if continues to feel dizzy consider inpatient rx of UTI.       ____________________________________________   FINAL CLINICAL IMPRESSION(S) / ED DIAGNOSES   Final diagnoses:  Orthostatic hypotension      MEDICATIONS GIVEN DURING THIS VISIT:  Medications  sodium chloride 0.9 % bolus 1,000 mL (1,000 mLs Intravenous New Bag/Given 04/04/19 1427)     ED Discharge Orders    None       Note:  This document was prepared using Dragon voice recognition software and may include unintentional dictation errors.   Concha Se, MD 04/04/19 [phone]

## 2019-04-05 DIAGNOSIS — J1289 Other viral pneumonia: Secondary | ICD-10-CM | POA: Diagnosis not present

## 2019-04-05 DIAGNOSIS — U071 COVID-19: Secondary | ICD-10-CM | POA: Diagnosis not present

## 2019-04-05 LAB — COMPREHENSIVE METABOLIC PANEL
ALT: 12 U/L (ref 0–44)
AST: 23 U/L (ref 15–41)
Albumin: 3.1 g/dL — ABNORMAL LOW (ref 3.5–5.0)
Alkaline Phosphatase: 53 U/L (ref 38–126)
Anion gap: 10 (ref 5–15)
BUN: 29 mg/dL — ABNORMAL HIGH (ref 8–23)
CO2: 25 mmol/L (ref 22–32)
Calcium: 8.2 mg/dL — ABNORMAL LOW (ref 8.9–10.3)
Chloride: 101 mmol/L (ref 98–111)
Creatinine, Ser: 1.37 mg/dL — ABNORMAL HIGH (ref 0.44–1.00)
GFR calc Af Amer: 40 mL/min — ABNORMAL LOW (ref >60)
GFR calc non Af Amer: 35 mL/min — ABNORMAL LOW (ref >60)
Glucose, Bld: 114 mg/dL — ABNORMAL HIGH (ref 70–99)
Potassium: 3 mmol/L — ABNORMAL LOW (ref 3.5–5.1)
Sodium: 136 mmol/L (ref 135–145)
Total Bilirubin: 0.9 mg/dL (ref 0.3–1.2)
Total Protein: 6.5 g/dL (ref 6.5–8.1)

## 2019-04-05 LAB — D-DIMER, QUANTITATIVE: D-Dimer, Quant: 1.14 ug/mL-FEU — ABNORMAL HIGH (ref 0.00–0.50)

## 2019-04-05 LAB — CBC WITH DIFFERENTIAL/PLATELET
Abs Immature Granulocytes: 0.03 10*3/uL (ref 0.00–0.07)
Basophils Absolute: 0 10*3/uL (ref 0.0–0.1)
Basophils Relative: 0 %
Eosinophils Absolute: 0 10*3/uL (ref 0.0–0.5)
Eosinophils Relative: 1 %
HCT: 30.7 % — ABNORMAL LOW (ref 36.0–46.0)
Hemoglobin: 10.2 g/dL — ABNORMAL LOW (ref 12.0–15.0)
Immature Granulocytes: 1 %
Lymphocytes Relative: 29 %
Lymphs Abs: 1.4 10*3/uL (ref 0.7–4.0)
MCH: 28.1 pg (ref 26.0–34.0)
MCHC: 33.2 g/dL (ref 30.0–36.0)
MCV: 84.6 fL (ref 80.0–100.0)
Monocytes Absolute: 0.6 10*3/uL (ref 0.1–1.0)
Monocytes Relative: 13 %
Neutro Abs: 2.8 10*3/uL (ref 1.7–7.7)
Neutrophils Relative %: 56 %
Platelets: 225 10*3/uL (ref 150–400)
RBC: 3.63 MIL/uL — ABNORMAL LOW (ref 3.87–5.11)
RDW: 13.2 % (ref 11.5–15.5)
WBC: 4.9 10*3/uL (ref 4.0–10.5)
nRBC: 0 % (ref 0.0–0.2)

## 2019-04-05 LAB — C-REACTIVE PROTEIN: CRP: 7.8 mg/dL — ABNORMAL HIGH (ref <1.0)

## 2019-04-05 LAB — MAGNESIUM: Magnesium: 1.6 mg/dL — ABNORMAL LOW (ref 1.7–2.4)

## 2019-04-05 MED ORDER — MAGNESIUM SULFATE 2 GM/50ML IV SOLN
2.0000 g | Freq: Once | INTRAVENOUS | Status: AC
  Administered 2019-04-05: 2 g via INTRAVENOUS
  Filled 2019-04-05: qty 50

## 2019-04-05 MED ORDER — POTASSIUM CHLORIDE 2 MEQ/ML IV SOLN
INTRAVENOUS | Status: AC
  Administered 2019-04-05: 09:00:00 via INTRAVENOUS
  Filled 2019-04-05: qty 1000

## 2019-04-05 MED ORDER — CARVEDILOL 3.125 MG PO TABS
6.2500 mg | ORAL_TABLET | Freq: Two times a day (BID) | ORAL | Status: DC
  Administered 2019-04-05 – 2019-04-07 (×4): 6.25 mg via ORAL
  Filled 2019-04-05 (×4): qty 2

## 2019-04-05 MED ORDER — POTASSIUM CHLORIDE CRYS ER 20 MEQ PO TBCR
40.0000 meq | EXTENDED_RELEASE_TABLET | Freq: Once | ORAL | Status: AC
  Administered 2019-04-05: 40 meq via ORAL
  Filled 2019-04-05: qty 2

## 2019-04-05 MED ORDER — PHENOL 1.4 % MT LIQD
1.0000 | OROMUCOSAL | Status: DC | PRN
  Filled 2019-04-05: qty 177

## 2019-04-05 MED ORDER — LACTULOSE 10 GM/15ML PO SOLN: 10.0000 g | Freq: Two times a day (BID) | ORAL | Status: DC | PRN

## 2019-04-05 NOTE — Progress Notes (Signed)
Physical Therapy Evaluation Patient Details Name: Kelly BatemanYvonne J Sullivan MRN: [id number] DOB: 12/20/1930 Today's Date: 04/05/2019   History of Present Illness  85 y.o. female admitted on 04/04/19 for weakness.  Found to be COVID 19 (+).  Pt also found to be dehydrated with acute kidney injury.  Pt with other significant PMH of HTN, glaucoma, dementia, CKD, heart disease, arhtritis, anxiety.    Clinical Impression  Pt is generally weak and deconditioned, however, with RW use can be min guard assist for mobility.  I spoke with her daughter on the phone who is worried that she won't be able to make it up the flight of stairs to her room.  OT asked to attempt repeated step ups with box step tomorrow.  Pt was active with [facility] HHPT, HHOT, and aide.  Please resume these at discharge.  I did inform daughter that someone would need to be present to physically help her for her first few days at home and that I recommended full time RW use for now until the home therapy team can get her stronger.   PT to follow acutely for deficits listed below.      Follow Up Recommendations Home health PT;Other (comment)(HHOT, HH aide (res[facility]    Equipment Recommendations  None recommended by PT    Recommendations for Other Services   NA    Precautions / Restrictions Precautions Precautions: Fall Precaution Comments: h/o recent fall in the bathroom Restrictions Weight Bearing Restrictions: No      Mobility  Bed Mobility               General bed mobility comments: Pt was OOB in the recliner chair.   Transfers Overall transfer level: Needs assistance Equipment used: Rolling walker (2 wheeled) Transfers: Sit to/from Stand Sit to Stand: Min guard         General transfer comment: Min guard from recliner chair and low toilet seat  Ambulation/Gait Ambulation/Gait assistance: Min guard Gait Distance (Feet): 15 Feet(15'x1, 80'x1) Assistive device: Rolling walker (2 wheeled);1  person hand held assist Gait Pattern/deviations: Step-through pattern;Staggering left;Staggering right     General Gait Details: Pt wanted to try without RW (hand held assist) as she reports at home she usually doesn't use anything inside and a cane outside, but she was too weak and unsteady to only do HHA.  We switched to RW and she could be min guard assist.           Balance Overall balance assessment: Needs assistance Sitting-balance support: Feet supported;No upper extremity supported Sitting balance-Leahy Scale: Good     Standing balance support: Bilateral upper extremity supported;Single extremity supported Standing balance-Leahy Scale: Fair Standing balance comment: able to pull up underwear and preform peri care with one hand on grab bar with supervision.                              Pertinent Vitals/Pain Pain Assessment: No/denies pain    Home Living Family/patient expects to be discharged to:: Private residence Living Arrangements: Children(daughter and son in law) Available Help at Discharge: Family;Available 24 hours/day(if needed, but daughter typically works) Type of Home: House Home Access: Stairs to enter Entrance Stairs-Rails: Right Entrance Stairs-Number of Steps: 4 Home Layout: Two level;Bed/bath upstairs Home Equipment: Walker - 2 wheels;Walker - 4 wheels;Shower seat;Grab bars - tub/shower      Prior Function Level of Independence: Needs assistance   Gait / Transfers Assistance Needed:  per pt and daughter, she was not using an AD at home in the house, however, she was getting weaker and falling.  She rides her foot pedal bike TID, she does bathing, dressing supervised, and does her own toileting.             Hand Dominance        Extremity/Trunk Assessment   Upper Extremity Assessment Upper Extremity Assessment: Defer to OT evaluation    Lower Extremity Assessment Lower Extremity Assessment: Generalized weakness    Cervical /  Trunk Assessment Cervical / Trunk Assessment: Kyphotic  Communication   Communication: No difficulties  Cognition Arousal/Alertness: Awake/alert Behavior During Therapy: WFL for tasks assessed/performed Overall Cognitive Status: History of cognitive impairments - at baseline                                 General Comments: Pt alert, generally accurate hx when compared to daughter's report      General Comments General comments (skin integrity, edema, etc.): VSS throughout.  O2, HR and BP WNL as measured by peds finger probe.    Exercises   NA   Assessment/Plan    PT Assessment Patient needs continued PT services  PT Problem List Decreased strength;Decreased activity tolerance;Decreased balance;Decreased mobility;Decreased knowledge of use of DME;Decreased cognition;Decreased safety awareness       PT Treatment Interventions DME instruction;Gait training;Stair training;Functional mobility training;Therapeutic activities;Therapeutic exercise;Balance training;Patient/family education    PT Goals (Current goals can be found in the Care Plan section)  Acute Rehab PT Goals Patient Stated Goal: to go home and get stronger  PT Goal Formulation: With patient/family Time For Goal Achievement: 04/19/19 Potential to Achieve Goals: Good    Frequency Min 3X/week   Barriers to discharge Inaccessible home environment her bedroom is up a flight of stairs       AM-PAC PT "6 Clicks" Mobility  Outcome Measure Help needed turning from your back to your side while in a flat bed without using bedrails?: A Little Help needed moving from lying on your back to sitting on the side of a flat bed without using bedrails?: A Little Help needed moving to and from a bed to a chair (including a wheelchair)?: A Little Help needed standing up from a chair using your arms (e.g., wheelchair or bedside chair)?: A Little Help needed to walk in hospital room?: A Little Help needed climbing 3-5  steps with a railing? : A Little 6 Click Score: 18    End of Session   Activity Tolerance: Patient limited by fatigue Patient left: in chair;with call bell/phone within reach   PT Visit Diagnosis: Muscle weakness (generalized) (M62.81);History of falling (Z91.81);Difficulty in walking, not elsewhere classified (R26.2)    Time: 8786-7672 PT Time Calculation (min) (ACUTE ONLY): 20 min   Charges:      Wells Guiles B. Tracina Beaumont, PT, DPT  Acute Rehabilitation [phone] pager [phone]) [phone] office  @ [facility]: [phone]   PT Evaluation $PT Eval Moderate Complexity: 1 Mod          04/05/2019, 12:39 PM

## 2019-04-05 NOTE — TOC Initial Note (Signed)
Transition of Care [facility]) - Initial/Assessment Note    Patient Details  Name: Kelly Sullivan MRN: [id number] Date of Birth: [birth year]  Transition of Care [facility]) CM/SW Contact:    Alberteen Sam,  Phone Number: [phone] 04/05/2019, 1:47 PM  Clinical Narrative:                  CSW acknowledges patient is active with [facility] for PT/OT and aide. Services to be resumed at time of discharge, CSW will continue to follow to update [facility] on patient's time of discharge and need for resumption of services.   Expected Discharge Plan: [place] Barriers to Discharge: Continued Medical Work up   Patient Goals and CMS Choice        Expected Discharge Plan and Services Expected Discharge Plan: [place] Choice: [place] arrangements for the past 2 months: Single Family Home Expected Discharge Date: 04/07/19                         HH Arranged: RN, OT, PT [place] Agency: [place]        Prior Living Arrangements/Services Living arrangements for the past 2 months: Single Family Home                Current home services: Homehealth aide, Home OT, Home PT    Activities of Daily Living Home Assistive Devices/Equipment: Cane (specify quad or straight) ADL Screening (condition at time of admission) Patient's cognitive ability adequate to safely complete daily activities?: Yes Is the patient deaf or have difficulty hearing?: Yes Does the patient have difficulty seeing, even when wearing glasses/contacts?: No Does the patient have difficulty concentrating, remembering, or making decisions?: Yes Patient able to express need for assistance with ADLs?: Yes Does the patient have difficulty dressing or bathing?: No Independently performs ADLs?: Yes (appropriate for developmental age) Does the patient have difficulty walking or climbing stairs?: No Weakness of Legs: Both Weakness of  Arms/Hands: None  Permission Sought/Granted                  Emotional Assessment       Orientation: : Oriented to Self, Oriented to Place, Oriented to  Time Alcohol / Substance Use: Not Applicable Psych Involvement: No (comment)  Admission diagnosis:  COVID POSITIVE Patient Active Problem List   Diagnosis Date Noted  . Pneumonia due to COVID-19 virus 04/04/2019  . HTN (hypertension) 04/04/2019  . Dementia without behavioral disturbance ([place]) 04/04/2019  . Hypothyroidism 04/04/2019  . AKI (acute kidney injury) ([place]) 04/04/2019  . Stable angina ([place]) 06/06/2016   PCP:  Lynnell Jude, MD Pharmacy:  No Pharmacies Listed    Social Determinants of Health (SDOH) Interventions    Readmission Risk Interventions No flowsheet data found.

## 2019-04-05 NOTE — Progress Notes (Signed)
PROGRESS NOTE                                                                                                                                                                                                             Patient Demographics:    Kelly Sullivan, is a 85 y.o. female, DOB - 1931/02/10, HMC:[id number]  Outpatient Primary MD for the patient is Clemmie Krill Lynnell Jude, MD    LOS - 1  Admit date - 04/04/2019    CC - Suncope     Brief Narrative  - Kelly Sullivan is a 85 y.o. female with medical history significant of mild dementia, HTN, hypothyroidism.  Who lives at home and was doing well, recently was being treated with Cipro for UTI, was also getting home PT OT.  On her PT OT session she was found to be dizzy was brought to the ER where she had evidence of dehydration, AKI and orthostatic hypotension.  She was also found to have mild COVID-19 pneumonia and admitted here.   Subjective:    Kelly Sullivan today has, No headache, No chest pain, No abdominal pain - No Nausea, No new weakness tingling or numbness, no Cough - SOB.     Assessment  & Plan :     1.  Acute Covid 19 Viral Pneumonitis during the ongoing 2020 Covid 19 Pandemic - patient seems to be symptom-free from this problem, does have evidence of mild pneumonia on chest x-ray but no hypoxia cough or shortness of breath.  Being monitored on low-dose Decadron only.  COVID-19 Labs  Recent Labs    04/04/19 1658 04/04/19 1955 04/05/19 0056  DDIMER  --   --  1.14*  FERRITIN 330*  --   --   LDH 164  --   --   CRP 7.2* 7.8* 7.8*    Lab Results  Component Value Date   SARSCOV2NAA POSITIVE (A) 04/04/2019     Hepatic Function Latest Ref Rng & Units 04/05/2019 04/04/2019 10/21/2017  Total Protein 6.5 - 8.1 g/dL 6.5 6.9 7.9  Albumin 3.5 - 5.0 g/dL 3.1(L) 3.4(L) 4.2  AST 15 - 41 U/L _0 ALT 0 - 44 U/L 12 11 10(L)  Alk Phosphatase 38 - 126 U/L 53 51 50   Total Bilirubin 0.3 - 1.2 mg/dL 0.9 0.9 0.7  No results found for: BNP    2.  Dehydration, AKI with orthostatic hypotension and syncope.  Much improved after offending medications which were HCTZ and ACE inhibitor on hold, hydrated with IV fluids, TED stockings, feels a whole lot better and currently symptom-free.  Advance activity, monitor orthostatics, PT eval.  3.  Mild dementia.  Supportive care.  At risk for delirium.  Minimize narcotics and benzodiazepines.  4.  Dyslipidemia.  On home medications.  5.  Hypothyroidism.  On Synthroid.  6.  Hypertension.  Currently on Imdur, low-dose Norvasc added for better control.  ACE inhibitor and ARB on hold due to dehydration, AKI upon admission.  7.  Hypokalemia and hypomagnesemia.  Both replaced will monitor.    Condition - Fair  Family Communication  : Detailed message left for daughter on her cell phone on 04/05/2019.  8:45 AM.  Code Status : Full code  Diet :   Diet Order            Diet Heart Room service appropriate? Yes; Fluid consistency: Thin  Diet effective now               Disposition Plan  : Home likely 04/07/2019.  Consults  :  None  Procedures  :     PUD Prophylaxis :    DVT Prophylaxis  :  Lovenox   Lab Results  Component Value Date   PLT 225 04/05/2019    Inpatient Medications  Scheduled Meds: . aspirin  81 mg Oral Daily  . brimonidine  1 drop Right Eye BID  . brinzolamide  1 drop Right Eye BID  . cholecalciferol  2,000 Units Oral Daily  . dexamethasone  6 mg Oral Q24H  . donepezil  10 mg Oral QHS  . enoxaparin (LOVENOX) injection  30 mg Subcutaneous Q24H  . isosorbide mononitrate  30 mg Oral Daily  . lamoTRIgine  50 mg Oral BID  . latanoprost  1 drop Right Eye QHS  . levothyroxine  100 mcg Oral QAC breakfast  . mirtazapine  7.5 mg Oral QHS  . oxybutynin  5 mg Oral QHS  . sertraline  200 mg Oral Daily  . traZODone  50 mg Oral QHS   Continuous Infusions: . lactated ringers with kcl 75  mL/hr at 04/05/19 0841   PRN Meds:.acetaminophen, chlorpheniramine-HYDROcodone, docusate sodium, guaiFENesin-dextromethorphan, lactulose, [DISCONTINUED] ondansetron **OR** ondansetron (ZOFRAN) IV, phenol  Antibiotics  :    Anti-infectives (From admission, onward)   None       Time Spent in minutes  30   Lala Lund M.D on 04/05/2019 at 8:44 AM  To page go to [email or web address] - password [facility]  [facility] -  Office  [phone]    See all Orders from today for further details    Objective:   Vitals:   04/04/19 1847 04/04/19 2030 04/05/19 0455 04/05/19 0832  BP: (!) 147/62 (!) 143/66 139/62 (!) 169/88  Pulse:  72  80  Resp: 18 (!) 27  16  Temp: 98.9 F (37.2 C) 99.1 F (37.3 C) 97.7 F (36.5 C) 98.4 F (36.9 C)  TempSrc: Oral Oral Oral Oral  SpO2: 98% 94%  96%  Weight: 65 kg     Height: '5\' 4"'$  (1.626 m)       Wt Readings from Last 3 Encounters:  04/04/19 65 kg  04/04/19 68.9 kg  10/20/17 68.9 kg     Intake/Output Summary (Last 24 hours) at 04/05/2019 0844 Last data filed at 04/05/2019 0300 Gross per  24 hour  Intake 537 ml  Output 500 ml  Net 37 ml     Physical Exam  Awake Alert,  No new F.N deficits, Normal affect [place].AT,PERRAL Supple Neck,No JVD, No cervical lymphadenopathy appriciated.  Symmetrical Chest wall movement, Good air movement bilaterally, CTAB RRR,No Gallops,Rubs or new Murmurs, No Parasternal Heave +ve B.Sounds, Abd Soft, No tenderness, No organomegaly appriciated, No rebound - guarding or rigidity. No Cyanosis, Clubbing or edema, No new Rash or bruise     Data Review:    CBC Recent Labs  Lab 04/04/19 1359 04/05/19 0056  WBC 6.4 4.9  HGB 11.0* 10.2*  HCT 33.2* 30.7*  PLT 226 225  MCV 83.2 84.6  MCH 27.6 28.1  MCHC 33.1 33.2  RDW 13.2 13.2  LYMPHSABS 1.9 1.4  MONOABS 0.8 0.6  EOSABS 0.0 0.0  BASOSABS 0.0 0.0    Chemistries  Recent Labs  Lab 04/04/19 1359 04/05/19 0056  NA 135 136  K 3.7 3.0*  CL 96* 101  CO2  26 25  GLUCOSE 99 114*  BUN 35* 29*  CREATININE 1.91* 1.37*  CALCIUM 8.4* 8.2*  MG 1.8 1.6*  AST 24 23  ALT 11 12  ALKPHOS 51 53  BILITOT 0.9 0.9   ------------------------------------------------------------------------------------------------------------------ No results for input(s): CHOL, HDL, LDLCALC, TRIG, CHOLHDL, LDLDIRECT in the last 72 hours.  No results found for: HGBA1C ------------------------------------------------------------------------------------------------------------------ Recent Labs    04/04/19 1359  TSH 0.355    Cardiac Enzymes No results for input(s): CKMB, TROPONINI, MYOGLOBIN in the last 168 hours.  Invalid input(s): CK ------------------------------------------------------------------------------------------------------------------ No results found for: BNP  Micro Results Recent Results (from the past 240 hour(s))  SARS Coronavirus 2 [facility] order, Performed in [facility] hospital lab) Nasopharyngeal Nasopharyngeal Swab     Status: Abnormal   Collection Time: 04/04/19  1:59 PM   Specimen: Nasopharyngeal Swab  Result Value Ref Range Status   SARS Coronavirus 2 POSITIVE (A) NEGATIVE Final    Comment: RESULT CALLED TO, READ BACK BY AND VERIFIED WITH: TERESA CLAPP AT 1545 04/04/2019.PMF (NOTE) If result is NEGATIVE SARS-CoV-2 target nucleic acids are NOT DETECTED. The SARS-CoV-2 RNA is generally detectable in upper and lower  respiratory specimens during the acute phase of infection. The lowest  concentration of SARS-CoV-2 viral copies this assay can detect is 250  copies / mL. A negative result does not preclude SARS-CoV-2 infection  and should not be used as the sole basis for treatment or other  patient management decisions.  A negative result may occur with  improper specimen collection / handling, submission of specimen other  than nasopharyngeal swab, presence of viral mutation(s) within the  areas targeted by this assay, and inadequate  number of viral copies  (<250 copies / mL). A negative result must be combined with clinical  observations, patient history, and epidemiological information. If result is POSITIVE SARS-CoV-2 target nucleic acids are DETECTED. The  SARS-CoV-2 RNA is generally detectable in upper and lower  respiratory specimens during the acute phase of infection.  Positive  results are indicative of active infection with SARS-CoV-2.  Clinical  correlation with patient history and other diagnostic information is  necessary to determine patient infection status.  Positive results do  not rule out bacterial infection or co-infection with other viruses. If result is PRESUMPTIVE POSTIVE SARS-CoV-2 nucleic acids MAY BE PRESENT.   A presumptive positive result was obtained on the submitted specimen  and confirmed on repeat testing.  While 2019 novel coronavirus  (SARS-CoV-2) nucleic acids  may be present in the submitted sample  additional confirmatory testing may be necessary for epidemiological  and / or clinical management purposes  to differentiate between  SARS-CoV-2 and other Sarbecovirus currently known to infect humans.  If clinically indicated additional testing with an alternate test  methodology [phone]) is a dvised. The SARS-CoV-2 RNA is generally  detectable in upper and lower respiratory specimens during the acute  phase of infection. The expected result is Negative. Fact Sheet for Patients:  StrictlyIdeas.no Fact Sheet for Healthcare Providers: BankingDealers.co.za This test is not yet approved or cleared by the [place] FDA and has been authorized for detection and/or diagnosis of SARS-CoV-2 by FDA under an Emergency Use Authorization (EUA).  This EUA will remain in effect (meaning this test can be used) for the duration of the COVID-19 declaration under Section 564(b)(1) of the Act, 21 U.S.C. section 360bbb-3(b)(1), unless the  authorization is terminated or revoked sooner. Performed at [facility], [address], [place], [place] [address]     Radiology Reports Dg Chest Portable 1 View  Result Date: 04/04/2019 CLINICAL DATA:  Shortness of breath, weakness EXAM: PORTABLE CHEST 1 VIEW COMPARISON:  12/13/2015 FINDINGS: Cardiac silhouette is upper limits of normal in size, unchanged. Calcific aortic knob. Patchy right upper lobe airspace opacity. Mild streaky bibasilar opacities. No pleural effusion or pneumothorax. IMPRESSION: 1. Patchy right upper lobe airspace opacity suspicious for developing pneumonia in the appropriate clinical setting. 2. Mild bibasilar opacities, favor atelectasis. Electronically Signed   By: Davina Poke M.D.   On: 04/04/2019 15:36

## 2019-04-05 NOTE — Plan of Care (Signed)
Continue w POC, pt receiving IV hydration/electrolyte replacement. No acute concerns this shift. Daughter updated via phone this AM.

## 2019-04-05 NOTE — Plan of Care (Signed)
Daughter Sondra Come called to update on pt. Status left message @7 :45pm to follow up with facility with any questions

## 2019-04-06 DIAGNOSIS — N179 Acute kidney failure, unspecified: Secondary | ICD-10-CM | POA: Diagnosis present

## 2019-04-06 DIAGNOSIS — E876 Hypokalemia: Secondary | ICD-10-CM | POA: Diagnosis not present

## 2019-04-06 DIAGNOSIS — I951 Orthostatic hypotension: Secondary | ICD-10-CM | POA: Diagnosis present

## 2019-04-06 DIAGNOSIS — M1990 Unspecified osteoarthritis, unspecified site: Secondary | ICD-10-CM | POA: Diagnosis present

## 2019-04-06 DIAGNOSIS — Z888 Allergy status to other drugs, medicaments and biological substances status: Secondary | ICD-10-CM | POA: Diagnosis not present

## 2019-04-06 DIAGNOSIS — E039 Hypothyroidism, unspecified: Secondary | ICD-10-CM | POA: Diagnosis present

## 2019-04-06 DIAGNOSIS — J1289 Other viral pneumonia: Secondary | ICD-10-CM | POA: Diagnosis present

## 2019-04-06 DIAGNOSIS — F419 Anxiety disorder, unspecified: Secondary | ICD-10-CM | POA: Diagnosis present

## 2019-04-06 DIAGNOSIS — E860 Dehydration: Secondary | ICD-10-CM | POA: Diagnosis present

## 2019-04-06 DIAGNOSIS — U071 COVID-19: Secondary | ICD-10-CM | POA: Diagnosis present

## 2019-04-06 DIAGNOSIS — R531 Weakness: Secondary | ICD-10-CM | POA: Diagnosis present

## 2019-04-06 DIAGNOSIS — F0390 Unspecified dementia without behavioral disturbance: Secondary | ICD-10-CM | POA: Diagnosis present

## 2019-04-06 DIAGNOSIS — I10 Essential (primary) hypertension: Secondary | ICD-10-CM | POA: Diagnosis present

## 2019-04-06 DIAGNOSIS — Z7982 Long term (current) use of aspirin: Secondary | ICD-10-CM | POA: Diagnosis not present

## 2019-04-06 DIAGNOSIS — Z79890 Hormone replacement therapy (postmenopausal): Secondary | ICD-10-CM | POA: Diagnosis not present

## 2019-04-06 DIAGNOSIS — Z79899 Other long term (current) drug therapy: Secondary | ICD-10-CM | POA: Diagnosis not present

## 2019-04-06 LAB — COMPREHENSIVE METABOLIC PANEL
ALT: 11 U/L (ref 0–44)
AST: 24 U/L (ref 15–41)
Albumin: 3.2 g/dL — ABNORMAL LOW (ref 3.5–5.0)
Alkaline Phosphatase: 55 U/L (ref 38–126)
Anion gap: 11 (ref 5–15)
BUN: 28 mg/dL — ABNORMAL HIGH (ref 8–23)
CO2: 25 mmol/L (ref 22–32)
Calcium: 8.6 mg/dL — ABNORMAL LOW (ref 8.9–10.3)
Chloride: 101 mmol/L (ref 98–111)
Creatinine, Ser: 1.17 mg/dL — ABNORMAL HIGH (ref 0.44–1.00)
GFR calc Af Amer: 49 mL/min — ABNORMAL LOW (ref >60)
GFR calc non Af Amer: 42 mL/min — ABNORMAL LOW (ref >60)
Glucose, Bld: 139 mg/dL — ABNORMAL HIGH (ref 70–99)
Potassium: 3.8 mmol/L (ref 3.5–5.1)
Sodium: 137 mmol/L (ref 135–145)
Total Bilirubin: 0.4 mg/dL (ref 0.3–1.2)
Total Protein: 6.6 g/dL (ref 6.5–8.1)

## 2019-04-06 LAB — CBC WITH DIFFERENTIAL/PLATELET
Abs Immature Granulocytes: 0.03 10*3/uL (ref 0.00–0.07)
Basophils Absolute: 0 10*3/uL (ref 0.0–0.1)
Basophils Relative: 0 %
Eosinophils Absolute: 0 10*3/uL (ref 0.0–0.5)
Eosinophils Relative: 0 %
HCT: 31.3 % — ABNORMAL LOW (ref 36.0–46.0)
Hemoglobin: 10.2 g/dL — ABNORMAL LOW (ref 12.0–15.0)
Immature Granulocytes: 1 %
Lymphocytes Relative: 20 %
Lymphs Abs: 1 10*3/uL (ref 0.7–4.0)
MCH: 27.7 pg (ref 26.0–34.0)
MCHC: 32.6 g/dL (ref 30.0–36.0)
MCV: 85.1 fL (ref 80.0–100.0)
Monocytes Absolute: 0.3 10*3/uL (ref 0.1–1.0)
Monocytes Relative: 5 %
Neutro Abs: 3.6 10*3/uL (ref 1.7–7.7)
Neutrophils Relative %: 74 %
Platelets: 250 10*3/uL (ref 150–400)
RBC: 3.68 MIL/uL — ABNORMAL LOW (ref 3.87–5.11)
RDW: 13.2 % (ref 11.5–15.5)
WBC: 4.8 10*3/uL (ref 4.0–10.5)
nRBC: 0 % (ref 0.0–0.2)

## 2019-04-06 LAB — MAGNESIUM: Magnesium: 2.1 mg/dL (ref 1.7–2.4)

## 2019-04-06 LAB — C-REACTIVE PROTEIN: CRP: 6.5 mg/dL — ABNORMAL HIGH (ref <1.0)

## 2019-04-06 LAB — D-DIMER, QUANTITATIVE: D-Dimer, Quant: 0.73 ug/mL-FEU — ABNORMAL HIGH (ref 0.00–0.50)

## 2019-04-06 MED ORDER — LACTATED RINGERS IV SOLN
INTRAVENOUS | Status: AC
  Administered 2019-04-06: 10:00:00 via INTRAVENOUS

## 2019-04-06 MED ORDER — DIPHENHYDRAMINE HCL 12.5 MG/5ML PO ELIX
12.5000 mg | ORAL_SOLUTION | Freq: Once | ORAL | Status: AC
  Administered 2019-04-06: 12.5 mg via ORAL
  Filled 2019-04-06: qty 5

## 2019-04-06 NOTE — Progress Notes (Signed)
PROGRESS NOTE                                                                                                                                                                                                             Patient Demographics:    Kelly Sullivan, is a 85 y.o. female, DOB - 1931/07/31, LKT:[id number]  Outpatient Primary MD for the patient is Clemmie Krill Lynnell Jude, MD    LOS - 1  Admit date - 04/04/2019    CC - Suncope     Brief Narrative  - Kelly Sullivan is a 85 y.o. female with medical history significant of mild dementia, HTN, hypothyroidism.  Who lives at home and was doing well, recently was being treated with Cipro for UTI, was also getting home PT OT.  On her PT OT session she was found to be dizzy was brought to the ER where she had evidence of dehydration, AKI and orthostatic hypotension.  She was also found to have mild COVID-19 pneumonia and admitted here.   Subjective:   Patient in bed, appears comfortable, denies any headache, no fever, no chest pain or pressure, no shortness of breath , no abdominal pain. No focal weakness.  Improved dizziness.    Assessment  & Plan :     1.  Acute Covid 19 Viral Pneumonitis during the ongoing 2020 Covid 19 Pandemic - patient seems to be symptom-free from this problem, does have evidence of mild pneumonia on chest x-ray but no hypoxia cough or shortness of breath.  Being monitored on low-dose Decadron only.  COVID-19 Labs  Recent Labs    04/04/19 1658 04/04/19 1955 04/05/19 0056 04/06/19 0049  DDIMER  --   --  1.14* 0.73*  FERRITIN 330*  --   --   --   LDH 164  --   --   --   CRP 7.2* 7.8* 7.8* 6.5*    Lab Results  Component Value Date   SARSCOV2NAA POSITIVE (A) 04/04/2019     Hepatic Function Latest Ref Rng & Units 04/06/2019 04/05/2019 04/04/2019  Total Protein 6.5 - 8.1 g/dL 6.6 6.5 6.9  Albumin 3.5 - 5.0 g/dL 3.2(L) 3.1(L) 3.4(L)  AST 15 - 41 U/L _0 ALT 0 - 44 U/L _1 Alk Phosphatase 38 - 126 U/L 55 53 51  Total Bilirubin 0.3 - 1.2 mg/dL 0.4 0.9 0.9     No results found for: BNP    2.  Dehydration, AKI with orthostatic hypotension and syncope.  Much improved after offending medications which were HCTZ and ACE inhibitor on hold, continue gentle IV fluids, TED stockings, feels a whole lot better and currently symptom-free.  Advance activity, monitor orthostatics, PT eval. patient wants ultimately to go home with home PT and not SNF.  Likely discharge in the next 1 to 2 days if continues to have clinical improvement.  3.  Mild dementia.  Supportive care.  At risk for delirium.  Minimize narcotics and benzodiazepines.  4.  Dyslipidemia.  On home medications.  5.  Hypothyroidism.  On Synthroid.  6.  Hypertension.  Currently on Imdur, low-dose Norvasc added for better control.  ACE inhibitor and ARB on hold due to dehydration, AKI upon admission.  7.  Hypokalemia and hypomagnesemia.  Both replaced will monitor.    Condition - Fair  Family Communication  : Detailed message left for daughter on her cell phone on 04/05/2019.  8:45 AM.  Code Status : Full code  Diet :   Diet Order            Diet Heart Room service appropriate? Yes; Fluid consistency: Thin  Diet effective now               Disposition Plan  : Home likely 04/07/2019.  Consults  :  None  Procedures  :     PUD Prophylaxis :    DVT Prophylaxis  :  Lovenox   Lab Results  Component Value Date   PLT 250 04/06/2019    Inpatient Medications  Scheduled Meds: . aspirin  81 mg Oral Daily  . brimonidine  1 drop Right Eye BID  . brinzolamide  1 drop Right Eye BID  . carvedilol  6.25 mg Oral BID WC  . cholecalciferol  2,000 Units Oral Daily  . dexamethasone  6 mg Oral Q24H  . donepezil  10 mg Oral QHS  . enoxaparin (LOVENOX) injection  30 mg Subcutaneous Q24H  . isosorbide mononitrate  30 mg Oral Daily  . lamoTRIgine  50 mg Oral BID  . latanoprost   1 drop Right Eye QHS  . levothyroxine  100 mcg Oral QAC breakfast  . mirtazapine  7.5 mg Oral QHS  . oxybutynin  5 mg Oral QHS  . sertraline  200 mg Oral Daily  . traZODone  50 mg Oral QHS   Continuous Infusions: . lactated ringers     PRN Meds:.acetaminophen, chlorpheniramine-HYDROcodone, docusate sodium, guaiFENesin-dextromethorphan, lactulose, [DISCONTINUED] ondansetron **OR** ondansetron (ZOFRAN) IV, phenol  Antibiotics  :    Anti-infectives (From admission, onward)   None       Time Spent in minutes  30   Lala Lund M.D on 04/06/2019 at 9:29 AM  To page go to [email or web address] - password [email or web address]  [facility] -  Office  [phone]    See all Orders from today for further details    Objective:   Vitals:   04/05/19 1651 04/05/19 1925 04/06/19 0435 04/06/19 0821  BP: 136/68 (!) 96/54 (!) 148/67 139/76  Pulse: 74   (!) 108  Resp: 16   18  Temp: 98.8 F (37.1 C) 98.2 F (36.8 C) (!) 97.5 F (36.4 C) 97.8 F (36.6 C)  TempSrc: Oral Oral Oral Oral  SpO2: 95%   98%  Weight:      Height:  Wt Readings from Last 3 Encounters:  04/04/19 65 kg  04/04/19 68.9 kg  10/20/17 68.9 kg     Intake/Output Summary (Last 24 hours) at 04/06/2019 0929 Last data filed at 04/06/2019 0835 Gross per 24 hour  Intake 938 ml  Output 1000 ml  Net -62 ml     Physical Exam  Awake Alert,   No new F.N deficits, Normal affect [place].AT,PERRAL Supple Neck,No JVD, No cervical lymphadenopathy appriciated.  Symmetrical Chest wall movement, Good air movement bilaterally, CTAB RRR,No Gallops, Rubs or new Murmurs, No Parasternal Heave +ve B.Sounds, Abd Soft, No tenderness, No organomegaly appriciated, No rebound - guarding or rigidity. No Cyanosis, Clubbing or edema, No new Rash or bruise    Data Review:    CBC Recent Labs  Lab 04/04/19 1359 04/05/19 0056 04/06/19 0049  WBC 6.4 4.9 4.8  HGB 11.0* 10.2* 10.2*  HCT 33.2* 30.7* 31.3*  PLT 226 225 250  MCV 83.2 84.6 85.1   MCH 27.6 28.1 27.7  MCHC 33.1 33.2 32.6  RDW 13.2 13.2 13.2  LYMPHSABS 1.9 1.4 1.0  MONOABS 0.8 0.6 0.3  EOSABS 0.0 0.0 0.0  BASOSABS 0.0 0.0 0.0    Chemistries  Recent Labs  Lab 04/04/19 1359 04/05/19 0056 04/06/19 0049  NA 135 136 137  K 3.7 3.0* 3.8  CL 96* 101 101  CO2 _0 GLUCOSE 99 114* 139*  BUN 35* 29* 28*  CREATININE 1.91* 1.37* 1.17*  CALCIUM 8.4* 8.2* 8.6*  MG 1.8 1.6* 2.1  AST _1 ALT _2 ALKPHOS 51 53 55  BILITOT 0.9 0.9 0.4   ------------------------------------------------------------------------------------------------------------------ No results for input(s): CHOL, HDL, LDLCALC, TRIG, CHOLHDL, LDLDIRECT in the last 72 hours.  No results found for: HGBA1C ------------------------------------------------------------------------------------------------------------------ Recent Labs    04/04/19 1359  TSH 0.355    Cardiac Enzymes No results for input(s): CKMB, TROPONINI, MYOGLOBIN in the last 168 hours.  Invalid input(s): CK ------------------------------------------------------------------------------------------------------------------ No results found for: BNP  Micro Results Recent Results (from the past 240 hour(s))  SARS Coronavirus 2 [facility] order, Performed in [facility] hospital lab) Nasopharyngeal Nasopharyngeal Swab     Status: Abnormal   Collection Time: 04/04/19  1:59 PM   Specimen: Nasopharyngeal Swab  Result Value Ref Range Status   SARS Coronavirus 2 POSITIVE (A) NEGATIVE Final    Comment: RESULT CALLED TO, READ BACK BY AND VERIFIED WITH: TERESA CLAPP AT 1545 04/04/2019.PMF (NOTE) If result is NEGATIVE SARS-CoV-2 target nucleic acids are NOT DETECTED. The SARS-CoV-2 RNA is generally detectable in upper and lower  respiratory specimens during the acute phase of infection. The lowest  concentration of SARS-CoV-2 viral copies this assay can detect is 250  copies / mL. A negative result does not preclude  SARS-CoV-2 infection  and should not be used as the sole basis for treatment or other  patient management decisions.  A negative result may occur with  improper specimen collection / handling, submission of specimen other  than nasopharyngeal swab, presence of viral mutation(s) within the  areas targeted by this assay, and inadequate number of viral copies  (<250 copies / mL). A negative result must be combined with clinical  observations, patient history, and epidemiological information. If result is POSITIVE SARS-CoV-2 target nucleic acids are DETECTED. The  SARS-CoV-2 RNA is generally detectable in upper and lower  respiratory specimens during the acute phase of infection.  Positive  results are indicative of active infection with SARS-CoV-2.  Clinical  correlation  with patient history and other diagnostic information is  necessary to determine patient infection status.  Positive results do  not rule out bacterial infection or co-infection with other viruses. If result is PRESUMPTIVE POSTIVE SARS-CoV-2 nucleic acids MAY BE PRESENT.   A presumptive positive result was obtained on the submitted specimen  and confirmed on repeat testing.  While 2019 novel coronavirus  (SARS-CoV-2) nucleic acids may be present in the submitted sample  additional confirmatory testing may be necessary for epidemiological  and / or clinical management purposes  to differentiate between  SARS-CoV-2 and other Sarbecovirus currently known to infect humans.  If clinically indicated additional testing with an alternate test  methodology [phone]) is a dvised. The SARS-CoV-2 RNA is generally  detectable in upper and lower respiratory specimens during the acute  phase of infection. The expected result is Negative. Fact Sheet for Patients:  StrictlyIdeas.no Fact Sheet for Healthcare Providers: BankingDealers.co.za This test is not yet approved or cleared by the  [place] FDA and has been authorized for detection and/or diagnosis of SARS-CoV-2 by FDA under an Emergency Use Authorization (EUA).  This EUA will remain in effect (meaning this test can be used) for the duration of the COVID-19 declaration under Section 564(b)(1) of the Act, 21 U.S.C. section 360bbb-3(b)(1), unless the authorization is terminated or revoked sooner. Performed at [facility], [address] [place], [place] [address]     Radiology Reports Dg Chest Portable 1 View  Result Date: 04/04/2019 CLINICAL DATA:  Shortness of breath, weakness EXAM: PORTABLE CHEST 1 VIEW COMPARISON:  12/13/2015 FINDINGS: Cardiac silhouette is upper limits of normal in size, unchanged. Calcific aortic knob. Patchy right upper lobe airspace opacity. Mild streaky bibasilar opacities. No pleural effusion or pneumothorax. IMPRESSION: 1. Patchy right upper lobe airspace opacity suspicious for developing pneumonia in the appropriate clinical setting. 2. Mild bibasilar opacities, favor atelectasis. Electronically Signed   By: Davina Poke M.D.   On: 04/04/2019 15:36

## 2019-04-06 NOTE — Plan of Care (Signed)
Updated daughter Opal Sidles on current condition, answered all questions she would like to be updated in am about discharge

## 2019-04-06 NOTE — Progress Notes (Signed)
MD Candiss Norse notified pt has slight swelling in Left ear and mild flushing to Left cheek. Pt denies any itching, pain, or discomfort. One time order obtained for Benadryl 12.5mg  po obtained and given. Will continue to monitor pt remainder of this shift

## 2019-04-06 NOTE — Progress Notes (Signed)
Patient's daughter Opal Sidles updated w POC. No further questions/concerns at this time.

## 2019-04-06 NOTE — Progress Notes (Signed)
Occupational Therapy Evaluation Patient Details Name: Kelly BatemanYvonne J Sullivan MRN: [id number] DOB: 06/23/1931 Today's Date: 04/06/2019    History of Present Illness 85 y.o. female admitted on 04/04/19 for weakness.  Found to be COVID 19 (+).  Pt also found to be dehydrated with acute kidney injury.  Pt with other significant PMH of HTN, glaucoma, dementia, CKD, heart disease, arhtritis, anxiety.     Clinical Impression   PTA, pt living with daughter and was receiving HH services. Pt overall minguard A with ADL. Ambulated 400 ft @ unit on RA with 1 rest break and SpO2 95. Recommend resume HH servics after DC. Will follow acutely.     Follow Up Recommendations  Home health OT;Supervision/Assistance - 24 hour(initially)    Equipment Recommendations  None recommended by OT    Recommendations for Other Services       Precautions / Restrictions Precautions Precautions: Fall Precaution Comments: h/o recent fall in the bathroom      Mobility Bed Mobility Overal bed mobility: Modified Independent                Transfers Overall transfer level: Needs assistance Equipment used: Rolling walker (2 wheeled) Transfers: Sit to/from UGI CorporationStand;Stand Pivot Transfers Sit to Stand: Supervision Stand pivot transfers: Supervision            Balance Overall balance assessment: Needs assistance   Sitting balance-Leahy Scale: Good       Standing balance-Leahy Scale: Fair                             ADL either performed or assessed with clinical judgement   ADL Overall ADL's : Needs assistance/impaired                                     Functional mobility during ADLs: Supervision/safety;Rolling walker General ADL Comments: overall minguard for ADL tasks. Able to complete ADL with minimal dyspnea with SpO2 95     Vision         Perception     Praxis      Pertinent Vitals/Pain Pain Assessment: No/denies pain     Hand Dominance Right    Extremity/Trunk Assessment Upper Extremity Assessment Upper Extremity Assessment: Generalized weakness   Lower Extremity Assessment Lower Extremity Assessment: Defer to PT evaluation   Cervical / Trunk Assessment Cervical / Trunk Assessment: Kyphotic;Other exceptions(forward head)   Communication Communication Communication: No difficulties   Cognition Arousal/Alertness: Awake/alert Behavior During Therapy: WFL for tasks assessed/performed Overall Cognitive Status: History of cognitive impairments - at baseline                                     General Comments       Exercises Exercises: Other exercises   Shoulder Instructions      Home Living Family/patient expects to be discharged to:: Private residence Living Arrangements: Children Available Help at Discharge: Family;Available 24 hours/day Type of Home: House Home Access: Stairs to enter Entergy CorporationEntrance Stairs-Number of Steps: 4 Entrance Stairs-Rails: Right Home Layout: Two level;Bed/bath upstairs Alternate Level Stairs-Number of Steps: flight Alternate Level Stairs-Rails: Right Bathroom Shower/Tub: Chief Strategy OfficerTub/shower unit   Bathroom Toilet: Standard Bathroom Accessibility: Yes How Accessible: Accessible via walker Home Equipment: Walker - 2 wheels;Walker - 4 wheels;Shower seat;Grab bars - tub/shower  Prior Functioning/Environment Level of Independence: Needs assistance  Gait / Transfers Assistance Needed: per pt and daughter, she was not using an AD at home in the house, however, she was getting weaker and falling.  She rides her foot pedal bike TID, she does bathing, dressing supervised, and does her own toileting.   ADL's / Homemaking Assistance Needed: daughter assists with shower; before she got sick she says she was "pretty independent" - make her bed up. "I do everyting but cook"            OT Problem List: Decreased strength;Decreased activity tolerance;Decreased safety  awareness;Decreased knowledge of use of DME or AE;Cardiopulmonary status limiting activity      OT Treatment/Interventions: Self-care/ADL training;Therapeutic exercise;Energy conservation;DME and/or AE instruction;Therapeutic activities;Patient/family education    OT Goals(Current goals can be found in the care plan section) Acute Rehab OT Goals Patient Stated Goal: to go home and get stronger  OT Goal Formulation: With patient Time For Goal Achievement: 04/20/19 Potential to Achieve Goals: Good  OT Frequency: Min 3X/week   Barriers to D/C:            Co-evaluation              AM-PAC OT "6 Clicks" Daily Activity     Outcome Measure Help from another person eating meals?: None Help from another person taking care of personal grooming?: A Little Help from another person toileting, which includes using toliet, bedpan, or urinal?: A Little Help from another person bathing (including washing, rinsing, drying)?: A Little Help from another person to put on and taking off regular upper body clothing?: A Little Help from another person to put on and taking off regular lower body clothing?: A Little 6 Click Score: 19   End of Session Equipment Utilized During Treatment: Gait belt;Rolling walker Nurse Communication: Mobility status  Activity Tolerance: Patient tolerated treatment well Patient left: in bed;with call bell/phone within reach  OT Visit Diagnosis: Unsteadiness on feet (R26.81);Muscle weakness (generalized) (M62.81)                Time: 4401-0272 OT Time Calculation (min): 29 min Charges:  OT General Charges $OT Visit: 1 Visit OT Evaluation $OT Eval Moderate Complexity: 1 Mod OT Treatments $Self Care/Home Management : 8-22 mins  Maurie Boettcher, OT/L   Acute OT Clinical Specialist Acute Rehabilitation Services Pager [phone] Office [phone]   [facility] 04/06/2019, 4:15 PM

## 2019-04-06 NOTE — Plan of Care (Signed)
Monitored for safety

## 2019-04-07 LAB — COMPREHENSIVE METABOLIC PANEL
ALT: 12 U/L (ref 0–44)
AST: 28 U/L (ref 15–41)
Albumin: 3.3 g/dL — ABNORMAL LOW (ref 3.5–5.0)
Alkaline Phosphatase: 51 U/L (ref 38–126)
Anion gap: 6 (ref 5–15)
BUN: 27 mg/dL — ABNORMAL HIGH (ref 8–23)
CO2: 28 mmol/L (ref 22–32)
Calcium: 8.5 mg/dL — ABNORMAL LOW (ref 8.9–10.3)
Chloride: 104 mmol/L (ref 98–111)
Creatinine, Ser: 1.1 mg/dL — ABNORMAL HIGH (ref 0.44–1.00)
GFR calc Af Amer: 53 mL/min — ABNORMAL LOW (ref >60)
GFR calc non Af Amer: 45 mL/min — ABNORMAL LOW (ref >60)
Glucose, Bld: 126 mg/dL — ABNORMAL HIGH (ref 70–99)
Potassium: 4.3 mmol/L (ref 3.5–5.1)
Sodium: 138 mmol/L (ref 135–145)
Total Bilirubin: 0.5 mg/dL (ref 0.3–1.2)
Total Protein: 6.6 g/dL (ref 6.5–8.1)

## 2019-04-07 LAB — CBC WITH DIFFERENTIAL/PLATELET
Abs Immature Granulocytes: 0.04 10*3/uL (ref 0.00–0.07)
Basophils Absolute: 0 10*3/uL (ref 0.0–0.1)
Basophils Relative: 0 %
Eosinophils Absolute: 0 10*3/uL (ref 0.0–0.5)
Eosinophils Relative: 0 %
HCT: 31.5 % — ABNORMAL LOW (ref 36.0–46.0)
Hemoglobin: 10.3 g/dL — ABNORMAL LOW (ref 12.0–15.0)
Immature Granulocytes: 1 %
Lymphocytes Relative: 20 %
Lymphs Abs: 1.2 10*3/uL (ref 0.7–4.0)
MCH: 28.2 pg (ref 26.0–34.0)
MCHC: 32.7 g/dL (ref 30.0–36.0)
MCV: 86.3 fL (ref 80.0–100.0)
Monocytes Absolute: 0.3 10*3/uL (ref 0.1–1.0)
Monocytes Relative: 4 %
Neutro Abs: 4.5 10*3/uL (ref 1.7–7.7)
Neutrophils Relative %: 75 %
Platelets: 290 10*3/uL (ref 150–400)
RBC: 3.65 MIL/uL — ABNORMAL LOW (ref 3.87–5.11)
RDW: 13.2 % (ref 11.5–15.5)
WBC: 6 10*3/uL (ref 4.0–10.5)
nRBC: 0 % (ref 0.0–0.2)

## 2019-04-07 LAB — MAGNESIUM: Magnesium: 1.7 mg/dL (ref 1.7–2.4)

## 2019-04-07 LAB — D-DIMER, QUANTITATIVE: D-Dimer, Quant: 0.74 ug/mL-FEU — ABNORMAL HIGH (ref 0.00–0.50)

## 2019-04-07 LAB — C-REACTIVE PROTEIN: CRP: 2.8 mg/dL — ABNORMAL HIGH (ref <1.0)

## 2019-04-07 MED ORDER — CARVEDILOL 3.125 MG PO TABS: 3.1250 mg | ORAL_TABLET | Freq: Two times a day (BID) | ORAL | 0 refills | Status: DC

## 2019-04-07 MED ORDER — CARVEDILOL 6.25 MG PO TABS: 6.2500 mg | ORAL_TABLET | Freq: Two times a day (BID) | ORAL | 0 refills | Status: DC

## 2019-04-07 MED ORDER — METHYLPREDNISOLONE 4 MG PO TBPK: ORAL_TABLET | ORAL | 0 refills | Status: DC

## 2019-04-07 MED ORDER — METHYLPREDNISOLONE 4 MG PO TBPK: ORAL_TABLET | ORAL | 0 refills | Status: AC

## 2019-04-07 MED ORDER — AMLODIPINE BESYLATE 10 MG PO TABS: 10.0000 mg | ORAL_TABLET | Freq: Every day | ORAL | 0 refills | Status: AC

## 2019-04-07 MED ORDER — AMLODIPINE BESYLATE 10 MG PO TABS: 10.0000 mg | ORAL_TABLET | Freq: Every day | ORAL | 0 refills | Status: DC

## 2019-04-07 MED ORDER — AMLODIPINE BESYLATE 5 MG PO TABS
10.0000 mg | ORAL_TABLET | Freq: Every day | ORAL | Status: DC
  Administered 2019-04-07: 10 mg via ORAL
  Filled 2019-04-07: qty 2

## 2019-04-07 MED ORDER — CARVEDILOL 6.25 MG PO TABS: 6.2500 mg | ORAL_TABLET | Freq: Two times a day (BID) | ORAL | 0 refills | Status: AC

## 2019-04-07 NOTE — Plan of Care (Signed)
Will continue with plan of care. 

## 2019-04-07 NOTE — Progress Notes (Signed)
Occupational Therapy Treatment Patient Details Name: Kelly Sullivan MRN: [id number] DOB: [birth year] Today's Date: 04/07/2019    History of present illness 85 y.o. female admitted on 04/04/19 for weakness.  Found to be COVID 19 (+).  Pt also found to be dehydrated with acute kidney injury.  Pt with other significant PMH of HTN, glaucoma, dementia, CKD, heart disease, arhtritis, anxiety.     OT comments  Pt able to navigate 12 steps with minguard A without difficulty to simulate flight of steps at home. Educated pt on strategies to reduce risks of falls. Daughter called and updated on pt status regarding her ability to negotiate steps. Daughter appreciative. All further OT to be addressed by [facility].   Follow Up Recommendations  Home health OT;Supervision/Assistance - 24 hour    Equipment Recommendations  None recommended by OT    Recommendations for Other Services      Precautions / Restrictions Precautions Precautions: Fall Precaution Comments: h/o recent fall in the bathroom Restrictions Weight Bearing Restrictions: No       Mobility Bed Mobility Overal bed mobility: Modified Independent                Transfers Overall transfer level: Needs assistance Equipment used: Rolling walker (2 wheeled);1 person hand held assist Transfers: Sit to/from [facility] Sit to Stand: Supervision Stand pivot transfers: Supervision            Balance     Sitting balance-Leahy Scale: Good       Standing balance-Leahy Scale: Fair                             ADL either performed or assessed with clinical judgement   ADL                                         General ADL Comments: overall minguard to set up with ADL. Educated on reducing risk of falls. Pt verbalized understanding. Able to verbalize fall reducing strategies     Vision       Perception     Praxis      Cognition Arousal/Alertness: Awake/alert Behavior During  Therapy: WFL for tasks assessed/performed Overall Cognitive Status: History of cognitive impairments - at baseline                                          Exercises     Shoulder Instructions       General Comments Able to navigate 12 steps with minguard A for safety. 2/4 DOE    Pertinent Vitals/ Pain       Pain Assessment: No/denies pain  Home Living                                          Prior Functioning/Environment              Frequency  Min 3X/week        Progress Toward Goals  OT Goals(current goals can now be found in the care plan section)  Progress towards OT goals: Progressing toward goals  Acute Rehab OT Goals Patient Stated Goal: to go home and  get stronger  OT Goal Formulation: With patient Time For Goal Achievement: 04/20/19 Potential to Achieve Goals: Good ADL Goals Pt Will Perform Lower Body Bathing: with modified independence;sit to/from stand Pt Will Perform Lower Body Dressing: with modified independence;sit to/from stand Pt Will Transfer to Toilet: with modified independence;ambulating Additional ADL Goal #1: Pt will independently verbalize 3 strategies to reduce risk of falls  Plan Discharge plan remains appropriate    Co-evaluation                 AM-PAC OT "6 Clicks" Daily Activity     Outcome Measure   Help from another person eating meals?: None Help from another person taking care of personal grooming?: A Little Help from another person toileting, which includes using toliet, bedpan, or urinal?: A Little Help from another person bathing (including washing, rinsing, drying)?: A Little Help from another person to put on and taking off regular upper body clothing?: A Little Help from another person to put on and taking off regular lower body clothing?: A Little 6 Click Score: 19    End of Session Equipment Utilized During Treatment: Rolling walker  OT Visit Diagnosis: Unsteadiness on  feet (R26.81);Muscle weakness (generalized) (M62.81)   Activity Tolerance Patient tolerated treatment well   Patient Left in bed;with call bell/phone within reach   Nurse Communication Mobility status        Time: [id number] OT Time Calculation (min): 15 min  Charges: OT General Charges $OT Visit: 1 Visit OT Treatments $Self Care/Home Management : 8-22 mins  Luisa DagoHilary Lenna Hagarty, OT/L   Acute OT Clinical Specialist Acute Rehabilitation Services Pager [phone] Office [phone]    [facility],HILLARY 04/07/2019, 9:51 AM

## 2019-04-07 NOTE — Discharge Instructions (Signed)
Follow with Primary MD Dortha Kern, MD in 7 days   Get CBC, CMP, 2 view Chest X ray -  checked next visit within 1 week by Primary MD    Activity: As tolerated with Full fall precautions use walker/cane & assistance as needed  Disposition Home    Diet: Heart Healthy     Special Instructions: If you have smoked or chewed Tobacco  in the last 2 yrs please stop smoking, stop any regular Alcohol  and or any Recreational drug use.  On your next visit with your primary care physician please Get Medicines reviewed and adjusted.  Please request your Prim.MD to go over all Hospital Tests and Procedure/Radiological results at the follow up, please get all Hospital records sent to your Prim MD by signing hospital release before you go home.  If you experience worsening of your admission symptoms, develop shortness of breath, life threatening emergency, suicidal or homicidal thoughts you must seek medical attention immediately by calling 911 or calling your MD immediately  if symptoms less severe.  You Must read complete instructions/literature along with all the possible adverse reactions/side effects for all the Medicines you take and that have been prescribed to you. Take any new Medicines after you have completely understood and accpet all the possible adverse reactions/side effects.   Do not drive, operate heavy machinery, perform activities at heights, swimming or participation in water activities or provide baby sitting services if your were admitted for syncope or siezures until you have seen by Primary MD or a Neurologist and advised to do so again.      Person Under Monitoring Name: Kelly Sullivan  Location: [address] [place] [address]   Infection Prevention Recommendations for Individuals Confirmed to have, or Being Evaluated for, 2019 Novel Coronavirus (COVID-19) Infection Who Receive Care at Home  Individuals who are confirmed to have, or are being evaluated for,  COVID-19 should follow the prevention steps below until a healthcare provider or local or state health department says they can return to normal activities.  Stay home except to get medical care You should restrict activities outside your home, except for getting medical care. Do not go to work, school, or public areas, and do not use public transportation or taxis.  Call ahead before visiting your doctor Before your medical appointment, call the healthcare provider and tell them that you have, or are being evaluated for, COVID-19 infection. This will help the healthcare providers office take steps to keep other people from getting infected. Ask your healthcare provider to call the local or state health department.  Monitor your symptoms Seek prompt medical attention if your illness is worsening (e.g., difficulty breathing). Before going to your medical appointment, call the healthcare provider and tell them that you have, or are being evaluated for, COVID-19 infection. Ask your healthcare provider to call the local or state health department.  Wear a facemask You should wear a facemask that covers your nose and mouth when you are in the same room with other people and when you visit a healthcare provider. People who live with or visit you should also wear a facemask while they are in the same room with you.  Separate yourself from other people in your home As much as possible, you should stay in a different room from other people in your home. Also, you should use a separate bathroom, if available.  Avoid sharing household items You should not share dishes, drinking glasses, cups, eating utensils,  towels, bedding, or other items with other people in your home. After using these items, you should wash them thoroughly with soap and water.  Cover your coughs and sneezes Cover your mouth and nose with a tissue when you cough or sneeze, or you can cough or sneeze into your sleeve.  Throw used tissues in a lined trash can, and immediately wash your hands with soap and water for at least 20 seconds or use an alcohol-based hand rub.  Wash your [facility] your hands often and thoroughly with soap and water for at least 20 seconds. You can use an alcohol-based hand sanitizer if soap and water are not available and if your hands are not visibly dirty. Avoid touching your eyes, nose, and mouth with unwashed hands.   Prevention Steps for Caregivers and Household Members of Individuals Confirmed to have, or Being Evaluated for, COVID-19 Infection Being Cared for in the Home  If you live with, or provide care at home for, a person confirmed to have, or being evaluated for, COVID-19 infection please follow these guidelines to prevent infection:  Follow healthcare providers instructions Make sure that you understand and can help the patient follow any healthcare provider instructions for all care.  Provide for the patients basic needs You should help the patient with basic needs in the home and provide support for getting groceries, prescriptions, and other personal needs.  Monitor the patients symptoms If they are getting sicker, call his or her medical provider and tell them that the patient has, or is being evaluated for, COVID-19 infection. This will help the healthcare providers office take steps to keep other people from getting infected. Ask the healthcare provider to call the local or state health department.  Limit the number of people who have contact with the patient  If possible, have only one caregiver for the patient.  Other household members should stay in another home or place of residence. If this is not possible, they should stay  in another room, or be separated from the patient as much as possible. Use a separate bathroom, if available.  Restrict visitors who do not have an essential need to be in the home.  Keep older adults, very young  children, and other sick people away from the patient Keep older adults, very young children, and those who have compromised immune systems or chronic health conditions away from the patient. This includes people with chronic heart, lung, or kidney conditions, diabetes, and cancer.  Ensure good ventilation Make sure that shared spaces in the home have good air flow, such as from an air conditioner or an opened window, weather permitting.  Wash your hands often  Wash your hands often and thoroughly with soap and water for at least 20 seconds. You can use an alcohol based hand sanitizer if soap and water are not available and if your hands are not visibly dirty.  Avoid touching your eyes, nose, and mouth with unwashed hands.  Use disposable paper towels to dry your hands. If not available, use dedicated cloth towels and replace them when they become wet.  Wear a facemask and gloves  Wear a disposable facemask at all times in the room and gloves when you touch or have contact with the patients blood, body fluids, and/or secretions or excretions, such as sweat, saliva, sputum, nasal mucus, vomit, urine, or feces.  Ensure the mask fits over your nose and mouth tightly, and do not touch it during use.  Throw out disposable facemasks  and gloves after using them. Do not reuse.  Wash your hands immediately after removing your facemask and gloves.  If your personal clothing becomes contaminated, carefully remove clothing and launder. Wash your hands after handling contaminated clothing.  Place all used disposable facemasks, gloves, and other waste in a lined container before disposing them with other household waste.  Remove gloves and wash your hands immediately after handling these items.  Do not share dishes, glasses, or other household items with the patient  Avoid sharing household items. You should not share dishes, drinking glasses, cups, eating utensils, towels, bedding, or other items  with a patient who is confirmed to have, or being evaluated for, COVID-19 infection.  After the person uses these items, you should wash them thoroughly with soap and water.  Wash laundry thoroughly  Immediately remove and wash clothes or bedding that have blood, body fluids, and/or secretions or excretions, such as sweat, saliva, sputum, nasal mucus, vomit, urine, or feces, on them.  Wear gloves when handling laundry from the patient.  Read and follow directions on labels of laundry or clothing items and detergent. In general, wash and dry with the warmest temperatures recommended on the label.  Clean all areas the individual has used often  Clean all touchable surfaces, such as counters, tabletops, doorknobs, bathroom fixtures, toilets, phones, keyboards, tablets, and bedside tables, every day. Also, clean any surfaces that may have blood, body fluids, and/or secretions or excretions on them.  Wear gloves when cleaning surfaces the patient has come in contact with.  Use a diluted bleach solution (e.g., dilute bleach with 1 part bleach and 10 parts water) or a household disinfectant with a label that says EPA-registered for coronaviruses. To make a bleach solution at home, add 1 tablespoon of bleach to 1 quart (4 cups) of water. For a larger supply, add  cup of bleach to 1 gallon (16 cups) of water.  Read labels of cleaning products and follow recommendations provided on product labels. Labels contain instructions for safe and effective use of the cleaning product including precautions you should take when applying the product, such as wearing gloves or eye protection and making sure you have good ventilation during use of the product.  Remove gloves and wash hands immediately after cleaning.  Monitor yourself for signs and symptoms of illness Caregivers and household members are considered close contacts, should monitor their health, and will be asked to limit movement outside of the  home to the extent possible. Follow the monitoring steps for close contacts listed on the symptom monitoring form.   ? If you have additional questions, contact your local health department or call the epidemiologist on call at [phone] (available 24/7). ? This guidance is subject to change. For the most up-to-date guidance from [facility], please refer to their website: TripMetro.huhttps://www.cdc.gov/coronavirus/2019-ncov/hcp/guidance-prevent-spread.html

## 2019-04-07 NOTE — Discharge Summary (Addendum)
Kelly Sullivan WUJ:[id number] DOB: 12/11/1930 DOA: 04/04/2019  PCP: System, Pcp Not In  Admit date: 04/04/2019  Discharge date: 04/07/2019  Admitted From: Home  Disposition:  Home   Recommendations for Outpatient Follow-up:   Follow up with PCP in 1-2 weeks  PCP Please obtain BMP/CBC, 2 view CXR in 1week,  (see Discharge instructions)   PCP Please follow up on the following pending results: Monitor BMP, blood pressure and orthostatic vital signs   Home Health: PT, RN Equipment/Devices: None  Consultations: None  Discharge Condition: Stable    CODE STATUS: Full    Diet Recommendation: Heart Healthy   CC - Syncope   Brief history of present illness from the day of admission and additional interim summary    Kelly Sullivan a 85 y.o.femalewith medical history significant ofmild dementia, HTN, hypothyroidism.  Who lives at home and was doing well, recently was being treated with Cipro for UTI, was also getting home PT OT.  On her PT OT session she was found to be dizzy was brought to the ER where she had evidence of dehydration, AKI and orthostatic hypotension.  She was also found to have mild COVID-19 pneumonia and admitted here.                                                                 Hospital Course     1.  Acute Covid 19 Viral Pneumonitis during the ongoing 2020 Covid 19 Pandemic - patient seems to be symptom-free from this problem, does have evidence of mild pneumonia on chest x-ray but no hypoxia cough or shortness of breath.    Treated with low-dose steroids, remains symptom-free on room air.  Will follow with PCP in a week for this.  Will be given low-dose steroid taper.  COVID-19 Labs  Recent Labs    04/04/19 1658  04/05/19 0056 04/06/19 0049 04/07/19 0425  DDIMER  --   --  1.14* 0.73*  0.74*  FERRITIN 330*  --   --   --   --   LDH 164  --   --   --   --   CRP 7.2*   < > 7.8* 6.5* 2.8*   < > = values in this interval not displayed.    Lab Results  Component Value Date   SARSCOV2NAA POSITIVE (A) 04/04/2019      2.  Dehydration, AKI with orthostatic hypotension and syncope.  Much improved after offending medications which were HCTZ and ARB on hold, he was hydrated with IV fluids, TED stockings, feels a whole lot better and currently symptom-free.    Worked with PT, no more evidence of syncope, blood pressure medications have been adjusted, will repeat requested to follow with PCP in a week for monitoring of BMP, blood pressure and orthostatic  vital signs.  3.  Mild dementia.  Supportive care.  At risk for delirium.  Minimize narcotics and benzodiazepines.  4.  Dyslipidemia.  On home medications.  5.  Hypothyroidism.  On Synthroid.  6.  Hypertension.    Blood pressure medications changed as a #3 above, currently on Norvasc and Coreg will follow with PCP for further monitoring and adjustment.  7.  Hypokalemia and hypomagnesemia.  Both replaced and stable.   Discharge diagnosis     Principal Problem:   Pneumonia due to COVID-19 virus Active Problems:   HTN (hypertension)   Dementia without behavioral disturbance (HCC)   Hypothyroidism   AKI (acute kidney injury) [facility](HCC)    Discharge instructions    Discharge Instructions    Diet - low sodium heart healthy   Complete by: As directed    Discharge instructions   Complete by: As directed    Follow with Primary MD Dortha KernBliss, Laura K, MD in 7 days   Get CBC, CMP, 2 view Chest X ray -  checked next visit within 1 week by Primary MD    Activity: As tolerated with Full fall precautions use walker/cane & assistance as needed  Disposition Home    Diet: Heart Healthy     Special Instructions: If you have smoked or chewed Tobacco  in the last 2 yrs please stop smoking, stop any regular Alcohol  and or any  Recreational drug use.  On your next visit with your primary care physician please Get Medicines reviewed and adjusted.  Please request your Prim.MD to go over all Hospital Tests and Procedure/Radiological results at the follow up, please get all Hospital records sent to your Prim MD by signing hospital release before you go home.  If you experience worsening of your admission symptoms, develop shortness of breath, life threatening emergency, suicidal or homicidal thoughts you must seek medical attention immediately by calling 911 or calling your MD immediately  if symptoms less severe.  You Must read complete instructions/literature along with all the possible adverse reactions/side effects for all the Medicines you take and that have been prescribed to you. Take any new Medicines after you have completely understood and accpet all the possible adverse reactions/side effects.   Do not drive, operate heavy machinery, perform activities at heights, swimming or participation in water activities or provide baby sitting services if your were admitted for syncope or siezures until you have seen by Primary MD or a Neurologist and advised to do so again.   Increase activity slowly   Complete by: As directed    MyChart COVID-19 home monitoring program   Complete by: Apr 07, 2019    Is the patient willing to use the MyChart Mobile App for home monitoring?: Yes   Temperature monitoring   Complete by: Apr 07, 2019    After how many days would you like to receive a notification of this patient's flowsheet entries?: 1      Discharge Medications   Allergies as of 04/07/2019      Reactions   Sudafed [pseudoephedrine Hcl] Hives      Medication List    STOP taking these medications   lactulose 10 GM/15ML solution Commonly known as: CHRONULAC   losartan-hydrochlorothiazide 100-25 MG tablet Commonly known as: HYZAAR     TAKE these medications   acetaminophen 500 MG tablet Commonly known as:  TYLENOL Take 1,000 mg by mouth 2 (two) times daily. Pt also takes one tablet every six hours as needed for pain.  amLODipine 10 MG tablet Commonly known as: NORVASC Take 1 tablet (10 mg total) by mouth daily.   aspirin 81 MG chewable tablet Chew 81 mg by mouth daily.   Astelin 0.1 % nasal spray Generic drug: azelastine Place 2 sprays into both nostrils 2 (two) times daily. Use in each nostril as directed   carvedilol 6.25 MG tablet Commonly known as: COREG Take 1 tablet (6.25 mg total) by mouth 2 (two) times daily with a meal.   cetirizine 10 MG tablet Commonly known as: ZYRTEC Take 10 mg by mouth daily.   cholecalciferol 1000 units tablet Commonly known as: VITAMIN D Take 2,000 Units by mouth daily.   docusate sodium 100 MG capsule Commonly known as: COLACE Take 100 mg by mouth daily as needed.   donepezil 5 MG tablet Commonly known as: ARICEPT Take 10 mg by mouth at bedtime.   isosorbide mononitrate 30 MG 24 hr tablet Commonly known as: IMDUR Take 30 mg by mouth daily.   lamoTRIgine 25 MG tablet Commonly known as: LAMICTAL Take 50 mg by mouth 2 (two) times daily.   latanoprost 0.005 % ophthalmic solution Commonly known as: XALATAN Place 1 drop into the right eye at bedtime.   levothyroxine 100 MCG tablet Commonly known as: SYNTHROID Take 100 mcg by mouth daily before breakfast.   methylPREDNISolone 4 MG Tbpk tablet Commonly known as: MEDROL DOSEPAK follow package directions   mirtazapine 7.5 MG tablet Commonly known as: REMERON Take 7.5 mg by mouth at bedtime.   oxybutynin 5 MG 24 hr tablet Commonly known as: DITROPAN-XL Take 5 mg by mouth at bedtime.   polyethylene glycol 17 g packet Commonly known as: MiraLax Take 17 g by mouth 2 (two) times daily.   sertraline 100 MG tablet Commonly known as: ZOLOFT Take 200 mg by mouth daily.   Simbrinza 1-0.2 % Susp Generic drug: Brinzolamide-Brimonidine Place 1 drop into the right eye 2 (two) times  daily.   traZODone 50 MG tablet Commonly known as: DESYREL Take 50 mg by mouth at bedtime.       Follow-up Information    Dortha Kern, MD. Schedule an appointment as soon as possible for a visit in 1 week(s).   Specialty: Family Medicine Contact information: [address] [place] [address] [phone]        [facility] Follow up.   Specialty: Home Health Services Why: A representative from [facility] will contact you to arrange start date and time for your therapy. Contact information: [address] [place] [address] [phone]           Major procedures and Radiology Reports - PLEASE review detailed and final reports thoroughly  -         Dg Chest Portable 1 View  Result Date: 04/04/2019 CLINICAL DATA:  Shortness of breath, weakness EXAM: PORTABLE CHEST 1 VIEW COMPARISON:  12/13/2015 FINDINGS: Cardiac silhouette is upper limits of normal in size, unchanged. Calcific aortic knob. Patchy right upper lobe airspace opacity. Mild streaky bibasilar opacities. No pleural effusion or pneumothorax. IMPRESSION: 1. Patchy right upper lobe airspace opacity suspicious for developing pneumonia in the appropriate clinical setting. 2. Mild bibasilar opacities, favor atelectasis. Electronically Signed   By: Duanne Guess M.D.   On: 04/04/2019 15:36    Micro Results     Recent Results (from the past 240 hour(s))  SARS Coronavirus 2 [facility] order, Performed in [facility] hospital lab) Nasopharyngeal Nasopharyngeal Swab  Status: Abnormal   Collection Time: 04/04/19  1:59 PM   Specimen: Nasopharyngeal Swab  Result Value Ref Range Status   SARS Coronavirus 2 POSITIVE (A) NEGATIVE Final    Comment: RESULT CALLED TO, READ BACK BY AND VERIFIED WITH: TERESA CLAPP AT 1545 04/04/2019.PMF (NOTE) If result is NEGATIVE SARS-CoV-2 target nucleic acids are NOT DETECTED. The SARS-CoV-2 RNA is generally detectable in upper and lower   respiratory specimens during the acute phase of infection. The lowest  concentration of SARS-CoV-2 viral copies this assay can detect is 250  copies / mL. A negative result does not preclude SARS-CoV-2 infection  and should not be used as the sole basis for treatment or other  patient management decisions.  A negative result may occur with  improper specimen collection / handling, submission of specimen other  than nasopharyngeal swab, presence of viral mutation(s) within the  areas targeted by this assay, and inadequate number of viral copies  (<250 copies / mL). A negative result must be combined with clinical  observations, patient history, and epidemiological information. If result is POSITIVE SARS-CoV-2 target nucleic acids are DETECTED. The  SARS-CoV-2 RNA is generally detectable in upper and lower  respiratory specimens during the acute phase of infection.  Positive  results are indicative of active infection with SARS-CoV-2.  Clinical  correlation with patient history and other diagnostic information is  necessary to determine patient infection status.  Positive results do  not rule out bacterial infection or co-infection with other viruses. If result is PRESUMPTIVE POSTIVE SARS-CoV-2 nucleic acids MAY BE PRESENT.   A presumptive positive result was obtained on the submitted specimen  and confirmed on repeat testing.  While 2019 novel coronavirus  (SARS-CoV-2) nucleic acids may be present in the submitted sample  additional confirmatory testing may be necessary for epidemiological  and / or clinical management purposes  to differentiate between  SARS-CoV-2 and other Sarbecovirus currently known to infect humans.  If clinically indicated additional testing with an alternate test  methodology [phone]) is a dvised. The SARS-CoV-2 RNA is generally  detectable in upper and lower respiratory specimens during the acute  phase of infection. The expected result is Negative. Fact  Sheet for Patients:  StrictlyIdeas.no Fact Sheet for Healthcare Providers: BankingDealers.co.za This test is not yet approved or cleared by the [place] FDA and has been authorized for detection and/or diagnosis of SARS-CoV-2 by FDA under an Emergency Use Authorization (EUA).  This EUA will remain in effect (meaning this test can be used) for the duration of the COVID-19 declaration under Section 564(b)(1) of the Act, 21 U.S.C. section 360bbb-3(b)(1), unless the authorization is terminated or revoked sooner. Performed at [facility], [address] [place], [place] [address]     Today   Subjective    Kelly Sullivan today has no headache,no chest abdominal pain,no new weakness tingling or numbness, feels much better wants to go home today.    Objective   Blood pressure (!) 115/55, pulse 69, temperature (!) 97 F (36.1 C), resp. rate 18, height 5\' 4"  (1.626 m), weight 65 kg, SpO2 97 %.   Intake/Output Summary (Last 24 hours) at 04/07/2019 1201 Last data filed at 04/07/2019 1000 Gross per 24 hour  Intake 886.39 ml  Output 400 ml  Net 486.39 ml    Exam  Awake Alert,   No new F.N deficits, Normal affect .AT,PERRAL Supple Neck,No JVD, No cervical lymphadenopathy appriciated.  Symmetrical Chest wall movement, Good air movement bilaterally, CTAB RRR,No Gallops,Rubs  or new Murmurs, No Parasternal Heave +ve B.Sounds, Abd Soft, Non tender, No organomegaly appriciated, No rebound -guarding or rigidity. No Cyanosis, Clubbing or edema, No new Rash or bruise   Data Review   CBC w Diff:  Lab Results  Component Value Date   WBC 6.0 04/07/2019   HGB 10.3 (L) 04/07/2019   HGB 13.0 10/09/2013   HCT 31.5 (L) 04/07/2019   HCT 38.8 10/09/2013   PLT 290 04/07/2019   PLT 221 10/09/2013   LYMPHOPCT 20 04/07/2019   MONOPCT 4 04/07/2019   EOSPCT 0 04/07/2019   BASOPCT 0 04/07/2019    CMP:  Lab Results  Component Value  Date   NA 138 04/07/2019   NA 137 10/09/2013   K 4.3 04/07/2019   K 4.2 10/09/2013   CL 104 04/07/2019   CL 103 10/09/2013   CO2 28 04/07/2019   CO2 30 10/09/2013   BUN 27 (H) 04/07/2019   BUN 22 (H) 10/09/2013   CREATININE 1.10 (H) 04/07/2019   CREATININE 1.22 10/09/2013   PROT 6.6 04/07/2019   PROT 6.8 10/09/2013   ALBUMIN 3.3 (L) 04/07/2019   ALBUMIN 3.3 (L) 10/09/2013   BILITOT 0.5 04/07/2019   BILITOT 0.4 10/09/2013   ALKPHOS 51 04/07/2019   ALKPHOS 57 10/09/2013   AST 28 04/07/2019   AST 23 10/09/2013   ALT 12 04/07/2019   ALT 15 10/09/2013  .   Total Time in preparing paper work, data evaluation and todays exam - 35 minutes  Susa Raring M.D on 04/07/2019 at 12:01 PM  [facility]   Office  [phone]

## 2019-04-07 NOTE — Progress Notes (Signed)
Pt pulled out IV refuses to have it replaced

## 2020-03-29 ENCOUNTER — Encounter: Payer: Self-pay | Admitting: *Deleted

## 2020-03-29 ENCOUNTER — Emergency Department: Payer: Medicare HMO

## 2020-03-29 ENCOUNTER — Other Ambulatory Visit: Payer: Self-pay

## 2020-03-29 DIAGNOSIS — E039 Hypothyroidism, unspecified: Secondary | ICD-10-CM | POA: Insufficient documentation

## 2020-03-29 DIAGNOSIS — M542 Cervicalgia: Secondary | ICD-10-CM | POA: Insufficient documentation

## 2020-03-29 DIAGNOSIS — S62115A Nondisplaced fracture of triquetrum [cuneiform] bone, left wrist, initial encounter for closed fracture: Secondary | ICD-10-CM | POA: Insufficient documentation

## 2020-03-29 DIAGNOSIS — F0390 Unspecified dementia without behavioral disturbance: Secondary | ICD-10-CM | POA: Insufficient documentation

## 2020-03-29 DIAGNOSIS — Y939 Activity, unspecified: Secondary | ICD-10-CM | POA: Insufficient documentation

## 2020-03-29 DIAGNOSIS — W108XXA Fall (on) (from) other stairs and steps, initial encounter: Secondary | ICD-10-CM | POA: Insufficient documentation

## 2020-03-29 DIAGNOSIS — M25512 Pain in left shoulder: Secondary | ICD-10-CM | POA: Insufficient documentation

## 2020-03-29 DIAGNOSIS — Z7982 Long term (current) use of aspirin: Secondary | ICD-10-CM | POA: Insufficient documentation

## 2020-03-29 DIAGNOSIS — S6992XA Unspecified injury of left wrist, hand and finger(s), initial encounter: Secondary | ICD-10-CM | POA: Diagnosis present

## 2020-03-29 DIAGNOSIS — Y999 Unspecified external cause status: Secondary | ICD-10-CM | POA: Diagnosis not present

## 2020-03-29 DIAGNOSIS — N183 Chronic kidney disease, stage 3 unspecified: Secondary | ICD-10-CM | POA: Insufficient documentation

## 2020-03-29 DIAGNOSIS — Z79899 Other long term (current) drug therapy: Secondary | ICD-10-CM | POA: Diagnosis not present

## 2020-03-29 DIAGNOSIS — I129 Hypertensive chronic kidney disease with stage 1 through stage 4 chronic kidney disease, or unspecified chronic kidney disease: Secondary | ICD-10-CM | POA: Insufficient documentation

## 2020-03-29 DIAGNOSIS — Y929 Unspecified place or not applicable: Secondary | ICD-10-CM | POA: Diagnosis not present

## 2020-03-29 NOTE — ED Triage Notes (Signed)
EMS brought pt in from home, st slipped going up steps and fell; c/o pain to left side

## 2020-03-29 NOTE — ED Triage Notes (Signed)
Pt brought in via ems from home. Pt fell down approx 8 steps.  No loc.  No vomiting.  Pt has left shoulder and left wrist pain.  Pt has neck pain.  Pt alert  Speech clear.

## 2020-03-30 ENCOUNTER — Emergency Department
Admission: EM | Admit: 2020-03-30 | Discharge: 2020-03-30 | Disposition: A | Discharge status: Home / Self Care | Payer: Medicare HMO | Attending: Emergency Medicine | Admitting: Emergency Medicine

## 2020-03-30 ENCOUNTER — Other Ambulatory Visit: Payer: Self-pay

## 2020-03-30 ENCOUNTER — Emergency Department: Payer: Medicare HMO

## 2020-03-30 DIAGNOSIS — S62115A Nondisplaced fracture of triquetrum [cuneiform] bone, left wrist, initial encounter for closed fracture: Secondary | ICD-10-CM | POA: Diagnosis not present

## 2020-03-30 DIAGNOSIS — T1490XA Injury, unspecified, initial encounter: Secondary | ICD-10-CM

## 2020-03-30 DIAGNOSIS — W19XXXA Unspecified fall, initial encounter: Secondary | ICD-10-CM

## 2020-03-30 DIAGNOSIS — M25512 Pain in left shoulder: Secondary | ICD-10-CM

## 2020-03-30 LAB — BASIC METABOLIC PANEL
Anion gap: 10 (ref 5–15)
BUN: 20 mg/dL (ref 8–23)
CO2: 27 mmol/L (ref 22–32)
Calcium: 8.7 mg/dL — ABNORMAL LOW (ref 8.9–10.3)
Chloride: 98 mmol/L (ref 98–111)
Creatinine, Ser: 1.35 mg/dL — ABNORMAL HIGH (ref 0.44–1.00)
GFR calc Af Amer: 41 mL/min — ABNORMAL LOW (ref >60)
GFR calc non Af Amer: 35 mL/min — ABNORMAL LOW (ref >60)
Glucose, Bld: 131 mg/dL — ABNORMAL HIGH (ref 70–99)
Potassium: 3.8 mmol/L (ref 3.5–5.1)
Sodium: 135 mmol/L (ref 135–145)

## 2020-03-30 LAB — CBC WITH DIFFERENTIAL/PLATELET
Abs Immature Granulocytes: 0.03 10*3/uL (ref 0.00–0.07)
Basophils Absolute: 0 10*3/uL (ref 0.0–0.1)
Basophils Relative: 0 %
Eosinophils Absolute: 0 10*3/uL (ref 0.0–0.5)
Eosinophils Relative: 0 %
HCT: 36.7 % (ref 36.0–46.0)
Hemoglobin: 12.1 g/dL (ref 12.0–15.0)
Immature Granulocytes: 0 %
Lymphocytes Relative: 13 %
Lymphs Abs: 1.4 10*3/uL (ref 0.7–4.0)
MCH: 26.9 pg (ref 26.0–34.0)
MCHC: 33 g/dL (ref 30.0–36.0)
MCV: 81.6 fL (ref 80.0–100.0)
Monocytes Absolute: 0.9 10*3/uL (ref 0.1–1.0)
Monocytes Relative: 9 %
Neutro Abs: 7.8 10*3/uL — ABNORMAL HIGH (ref 1.7–7.7)
Neutrophils Relative %: 78 %
Platelets: 207 10*3/uL (ref 150–400)
RBC: 4.5 MIL/uL (ref 3.87–5.11)
RDW: 14.5 % (ref 11.5–15.5)
WBC: 10.1 10*3/uL (ref 4.0–10.5)
nRBC: 0 % (ref 0.0–0.2)

## 2020-03-30 LAB — TROPONIN I (HIGH SENSITIVITY): Troponin I (High Sensitivity): 13 ng/L (ref <18)

## 2020-03-30 MED ORDER — OXYCODONE HCL 5 MG PO TABS: 2.5000 mg | ORAL_TABLET | Freq: Three times a day (TID) | ORAL | 0 refills | Status: AC | PRN

## 2020-03-30 MED ORDER — DOCUSATE SODIUM 100 MG PO CAPS: 100.0000 mg | ORAL_CAPSULE | Freq: Every day | ORAL | 0 refills | Status: AC | PRN

## 2020-03-30 MED ORDER — DOCUSATE SODIUM 100 MG PO CAPS: 100.0000 mg | ORAL_CAPSULE | Freq: Every day | ORAL | 0 refills | Status: DC | PRN

## 2020-03-30 MED ORDER — TRAMADOL HCL 50 MG PO TABS
50.0000 mg | ORAL_TABLET | Freq: Once | ORAL | Status: AC
  Administered 2020-03-30: 50 mg via ORAL
  Filled 2020-03-30: qty 1

## 2020-03-30 MED ORDER — OXYCODONE HCL 5 MG PO TABS: 2.5000 mg | ORAL_TABLET | Freq: Three times a day (TID) | ORAL | 0 refills | Status: DC | PRN

## 2020-03-30 NOTE — ED Notes (Signed)
Pt taken to CT.

## 2020-03-30 NOTE — Discharge Instructions (Addendum)
Take TYLENOL/ACETAMINOPHEN 500 mg every 4 hours for moderate pain. You can take up to 1000 mg every 8 hours but do not take more than a total of 3500 mg daily.  Take the OXYCODONE as needed for severe pain. Take COLACE/STOOL SOFTENER when taking this.  Wear the sling for your shoulder, but DO NOT WEAR for more than 5 days and make sure to move your arm as often as possible to help prevent stiffness

## 2020-03-30 NOTE — ED Provider Notes (Signed)
[facility] Emergency Department Provider Note  ____________________________________________   First MD Initiated Contact with Patient 03/30/20 1031     (approximate)  I have reviewed the triage vital signs and the nursing notes.   HISTORY  Chief Complaint Fall and Shoulder Pain   HPI Kelly Sullivan is a 86 y.o. female who presents to the emergency department for treatment and evaluation after falling down about 8 steps yesterday evening.  Patient states that she was using her cane and holding onto the handrail.  She is not sure what caused her to fall.  She is complaining of neck pain, left shoulder and left arm pain.  No alleviating measures attempted prior to arrival.  She does not believe that she lost consciousness but her family believes that she may have been "in and out for a minute."      Past Medical History:  Diagnosis Date  . Anxiety   . Arthritis   . Atherosclerotic heart disease of native coronary artery without angina pectoris   . Chronic kidney disease    Stage 3  . Dementia (HCC)   . Depression   . Glaucoma   . Hypertension   . Mixed hyperlipidemia   . Thyroid disease     Patient Active Problem List   Diagnosis Date Noted  . Pneumonia due to COVID-19 virus 04/04/2019  . HTN (hypertension) 04/04/2019  . Dementia without behavioral disturbance (HCC) 04/04/2019  . Hypothyroidism 04/04/2019  . AKI (acute kidney injury) (HCC) 04/04/2019  . Stable angina (HCC) 06/06/2016    Past Surgical History:  Procedure Laterality Date  . ABDOMINAL HYSTERECTOMY    . CARDIAC CATHETERIZATION Left 06/06/2016   Procedure: Left Heart Cath and Coronary Angiography;  Surgeon: Lamar Blinks, MD;  Location: [facility] INVASIVE CV LAB;  Service: Cardiovascular;  Laterality: Left;    Prior to Admission medications   Medication Sig Start Date End Date Taking? Authorizing Provider  acetaminophen (TYLENOL) 500 MG tablet Take 1,000 mg by mouth 2 (two)  times daily. Pt also takes one tablet every six hours as needed for pain.    [provider]  amLODipine (NORVASC) 10 MG tablet Take 1 tablet (10 mg total) by mouth daily. 04/07/19   Leroy Sea, MD  aspirin 81 MG chewable tablet Chew 81 mg by mouth daily.    [provider]  azelastine (ASTELIN) 0.1 % nasal spray Place 2 sprays into both nostrils 2 (two) times daily. Use in each nostril as directed    [provider]  Brinzolamide-Brimonidine (SIMBRINZA) 1-0.2 % SUSP Place 1 drop into the right eye 2 (two) times daily.    [provider]  carvedilol (COREG) 6.25 MG tablet Take 1 tablet (6.25 mg total) by mouth 2 (two) times daily with a meal. 04/07/19   Leroy Sea, MD  cetirizine (ZYRTEC) 10 MG tablet Take 10 mg by mouth daily.    [provider]  cholecalciferol (VITAMIN D) 1000 units tablet Take 2,000 Units by mouth daily.    [provider]  docusate sodium (COLACE) 100 MG capsule Take 1 capsule (100 mg total) by mouth daily as needed for up to 10 days (when taking oxycodone to prevent constipation). 03/30/20 04/09/20  Shaune Pollack, MD  donepezil (ARICEPT) 5 MG tablet Take 10 mg by mouth at bedtime.     [provider]  isosorbide mononitrate (IMDUR) 30 MG 24 hr tablet Take 30 mg by mouth daily.    [provider]  lamoTRIgine (LAMICTAL) 25 MG tablet Take 50 mg by mouth 2 (two) times daily.    [provider]  latanoprost (XALATAN) 0.005 % ophthalmic solution Place 1 drop into the right eye at bedtime.    [provider]  levothyroxine (SYNTHROID, LEVOTHROID) 100 MCG tablet Take 100 mcg by mouth daily before breakfast.    [provider]  methylPREDNISolone (MEDROL DOSEPAK) 4 MG TBPK tablet follow package directions 04/07/19   Leroy Sea, MD  mirtazapine (REMERON) 7.5 MG tablet Take 7.5 mg by mouth at bedtime.    [provider]  oxybutynin (DITROPAN-XL) 5 MG 24 hr tablet  Take 5 mg by mouth at bedtime.    [provider]  oxyCODONE (ROXICODONE) 5 MG immediate release tablet Take 0.5 tablets (2.5 mg total) by mouth every 8 (eight) hours as needed for severe pain. 03/30/20 03/30/21  Shaune Pollack, MD  polyethylene glycol [facility]) packet Take 17 g by mouth 2 (two) times daily. 10/21/17   Rebecka Apley, MD  sertraline (ZOLOFT) 100 MG tablet Take 200 mg by mouth daily.     [provider]  traZODone (DESYREL) 50 MG tablet Take 50 mg by mouth at bedtime.    [provider]    Allergies Sudafed [pseudoephedrine hcl]  No family history on file.  Social History Social History   Tobacco Use  . Smoking status: Never Smoker  . Smokeless tobacco: Never Used  Vaping Use  . Vaping Use: Never used  Substance Use Topics  . Alcohol use: No  . Drug use: Never    Review of Systems  Constitutional: No fever/chills Eyes: No visual changes. ENT: No sore throat. Cardiovascular: Denies chest pain. Respiratory: Denies shortness of breath. Gastrointestinal: No abdominal pain.  No nausea, no vomiting.  No diarrhea.  No constipation. Genitourinary: Negative for dysuria. Musculoskeletal: Positive for neck pain.  Positive for left shoulder and left wrist pain. Skin: Negative for open wounds or lesions.  Positive for ecchymosis. Neurological: Negative for headaches, focal weakness or numbness. ____________________________________________   PHYSICAL EXAM:  VITAL SIGNS: ED Triage Vitals  Enc Vitals Group     BP 03/29/20 2109 (!) 152/70     Pulse Rate 03/29/20 2109 75     Resp 03/29/20 2109 18     Temp 03/29/20 2109 97.8 F (36.6 C)     Temp Source 03/29/20 2109 Oral     SpO2 03/29/20 2034 97 %     Weight 03/29/20 2109 152 lb (68.9 kg)     Height --      Head Circumference --      Peak Flow --      Pain Score 03/29/20 2114 10     Pain Loc --      Pain Edu? --      Excl. in GC? --     Constitutional: Alert and oriented.   Overall well appearing and in no acute distress. Eyes: Conjunctivae are normal. PERRL. EOMI. Head: Atraumatic. Nose: No congestion/rhinnorhea. Mouth/Throat: Mucous membranes are moist.  Oropharynx non-erythematous. Neck: No stridor.  No focal midline tenderness. Cardiovascular: Normal rate, regular rhythm. Grossly normal heart sounds.  Good peripheral circulation. Respiratory: Normal respiratory effort.  No retractions. Lungs CTAB. Gastrointestinal: Soft and nontender. No distention. No abdominal bruits. No CVA tenderness. Musculoskeletal: Diffuse left shoulder tenderness. No tenderness over left elbow. Mild tenderness on exam of left wrist. Neurologic:  Normal speech and language. No gross focal neurologic deficits are appreciated. No gait instability.  Skin:  Skin is warm, dry and intact. No rash noted. Abrasions to left lower extremity. Psychiatric: Mood and affect are normal. Speech and behavior are normal.  ____________________________________________   LABS (all labs ordered are listed, but only abnormal results are displayed)  Labs Reviewed  CBC WITH DIFFERENTIAL/PLATELET - Abnormal; Notable for the following components:      Result Value   Neutro Abs 7.8 (*)    All other components within normal limits  BASIC METABOLIC PANEL - Abnormal; Notable for the following components:   Glucose, Bld 131 (*)    Creatinine, Ser 1.35 (*)    Calcium 8.7 (*)    GFR calc non Af Amer 35 (*)    GFR calc Af Amer 41 (*)    All other components within normal limits  URINALYSIS, COMPLETE (UACMP) WITH MICROSCOPIC  TROPONIN I (HIGH SENSITIVITY)   ____________________________________________  EKG  pending ____________________________________________  RADIOLOGY  ED MD interpretation:  Subtle, nondisplaced fracture of the dorsal aspect of the triquetrum.  Official radiology report(s): DG Chest 2 View  Result Date: 03/29/2020 CLINICAL DATA:  Status post fall. EXAM: CHEST - 2 VIEW  COMPARISON:  April 04, 2019 FINDINGS: There is no evidence of acute infiltrate, pleural effusion or pneumothorax. The heart size and mediastinal contours are within normal limits. Degenerative changes seen throughout the thoracic spine. IMPRESSION: No active cardiopulmonary disease. Electronically Signed   By: Aram Candela M.D.   On: 03/29/2020 22:02   DG Wrist Complete Left  Result Date: 03/29/2020 CLINICAL DATA:  Status post fall. EXAM: LEFT WRIST - COMPLETE 3+ VIEW COMPARISON:  None. FINDINGS: A small cortical defect is seen along the dorsal aspect of the triquetrum bone. This is best seen on the lateral view. There is no evidence of dislocation. Mild diffuse soft tissue swelling is seen. IMPRESSION: Small nondisplaced fracture along the dorsal aspect of the triquetrum bone. CT correlation is recommended. Electronically Signed   By: Aram Candela M.D.   On: 03/29/2020 22:00   CT Head Wo Contrast  Result Date: 03/29/2020 CLINICAL DATA:  Larey Seat downstairs, left shoulder and wrist pain, neck pain EXAM: CT HEAD WITHOUT CONTRAST CT CERVICAL SPINE WITHOUT CONTRAST TECHNIQUE: Multidetector CT imaging of the head and cervical spine was performed following the standard protocol without intravenous contrast. Multiplanar CT image reconstructions of the cervical spine were also generated. COMPARISON:  12/13/2015 FINDINGS: CT HEAD FINDINGS Brain: No acute infarct or hemorrhage. Lateral ventricles and midline structures are unremarkable. No acute extra-axial fluid collections. No mass effect. Vascular: No hyperdense vessel or unexpected calcification. Skull: Moderate left parietal scalp hematoma. Negative for fracture or focal lesion. Sinuses/Orbits: No acute finding. Other: None. CT CERVICAL SPINE FINDINGS Alignment: Grossly normal anatomic alignment. Skull base and vertebrae: No acute displaced fracture. Soft tissues and spinal canal: No prevertebral fluid or swelling. No visible canal hematoma. Disc  levels: Prominent facet hypertrophy is noted from C2 through C5, eccentric to the right. There is mild spondylosis at C5-6 and C6-7. Mild right predominant neural foraminal encroachment at C2-3, C3-4, and C4-5. Upper chest: Airway is patent. Biapical pleural and parenchymal scarring is noted. Other: Reconstructed images demonstrate no additional findings. IMPRESSION: 1. Moderate left parietal scalp hematoma. 2. No acute intracranial process. 3. No acute cervical spine fracture. Electronically Signed   By: Sharlet Salina M.D.   On: 03/29/2020 22:09   CT Cervical Spine Wo Contrast  Result Date: 03/29/2020 CLINICAL DATA:  Larey Seat downstairs, left shoulder and wrist pain, neck pain EXAM: CT HEAD WITHOUT CONTRAST  CT CERVICAL SPINE WITHOUT CONTRAST TECHNIQUE: Multidetector CT imaging of the head and cervical spine was performed following the standard protocol without intravenous contrast. Multiplanar CT image reconstructions of the cervical spine were also generated. COMPARISON:  12/13/2015 FINDINGS: CT HEAD FINDINGS Brain: No acute infarct or hemorrhage. Lateral ventricles and midline structures are unremarkable. No acute extra-axial fluid collections. No mass effect. Vascular: No hyperdense vessel or unexpected calcification. Skull: Moderate left parietal scalp hematoma. Negative for fracture or focal lesion. Sinuses/Orbits: No acute finding. Other: None. CT CERVICAL SPINE FINDINGS Alignment: Grossly normal anatomic alignment. Skull base and vertebrae: No acute displaced fracture. Soft tissues and spinal canal: No prevertebral fluid or swelling. No visible canal hematoma. Disc levels: Prominent facet hypertrophy is noted from C2 through C5, eccentric to the right. There is mild spondylosis at C5-6 and C6-7. Mild right predominant neural foraminal encroachment at C2-3, C3-4, and C4-5. Upper chest: Airway is patent. Biapical pleural and parenchymal scarring is noted. Other: Reconstructed images demonstrate no additional  findings. IMPRESSION: 1. Moderate left parietal scalp hematoma. 2. No acute intracranial process. 3. No acute cervical spine fracture. Electronically Signed   By: Sharlet Salina M.D.   On: 03/29/2020 22:09   CT Wrist Left Wo Contrast  Result Date: 03/30/2020 CLINICAL DATA:  Left wrist pain after fall EXAM: CT OF THE LEFT WRIST WITHOUT CONTRAST TECHNIQUE: Multidetector CT imaging was performed according to the standard protocol. Multiplanar CT image reconstructions were also generated. COMPARISON:  X-ray 03/29/2020 FINDINGS: Technical note: Examination is degraded by beam hardening artifact related to patient positioning. Bones/Joint/Cartilage Subtle nondisplaced fracture involving the dorsal aspect of the triquetrum (series 12, images 9-10; series 8, image 41). No additional fractures. Lunotriquetral interval is upper limits of normal. Mild-moderate degenerative changes involving the first carpometacarpal joint. Ligaments Suboptimally assessed by CT. Muscles and Tendons No obvious abnormality within the limitations of this exam. Soft tissues Soft tissue swelling and edema at the dorsal of the wrist and distal forearm. No well-defined soft tissue hematoma. IMPRESSION: 1. Subtle nondisplaced fracture involving the dorsal aspect of the triquetrum. 2. Soft tissue swelling and edema at the dorsal of the wrist and distal forearm. Electronically Signed   By: Duanne Guess D.O.   On: 03/30/2020 11:39   DG Shoulder Left  Result Date: 03/29/2020 CLINICAL DATA:  Status post fall. EXAM: LEFT SHOULDER - 2+ VIEW COMPARISON:  None. FINDINGS: There is no evidence of an acute fracture or dislocation. Moderate severity degenerative changes seen involving the left acromioclavicular joint. Soft tissues are unremarkable. IMPRESSION: Degenerative changes without evidence of an acute fracture or dislocation. Electronically Signed   By: Aram Candela M.D.   On: 03/29/2020 21:54     ____________________________________________   PROCEDURES  Procedure(s) performed (including Critical Care):  Procedures   ____________________________________________   INITIAL IMPRESSION / ASSESSMENT AND PLAN / ED COURSE       86 year old female presenting to the emergency after mechanical, nonsyncopal fall at home.  See HPI for further details.  Plan will be to get some baseline labs and EKG since she does not know why she fell.  Images obtained while awaiting ER room assignment show that she has a potential triquetrum fracture.  Will CT.  Patient care transferred to Dr. Erma Heritage who will follow the patient to disposition.    __________________________________________   FINAL CLINICAL IMPRESSION(S) / ED DIAGNOSES  Final diagnoses:  Injury  Fall, initial encounter  Closed nondisplaced fracture of triquetrum of left wrist, initial encounter  Acute pain of  left shoulder     ED Discharge Orders         Ordered    oxyCODONE (ROXICODONE) 5 MG immediate release tablet  Every 8 hours PRN,   Status:  Discontinued        03/30/20 1249    docusate sodium (COLACE) 100 MG capsule  Daily PRN,   Status:  Discontinued        03/30/20 1249    docusate sodium (COLACE) 100 MG capsule  Daily PRN        03/30/20 1310    oxyCODONE (ROXICODONE) 5 MG immediate release tablet  Every 8 hours PRN        03/30/20 1310           Note:  This document was prepared using Dragon voice recognition software and may include unintentional dictation errors.    Chinita Pesterriplett, Kekai Geter B, FNP 03/30/20 1535    Shaune PollackIsaacs, [place], MD 04/06/20 1454

## 2020-08-23 ENCOUNTER — Other Ambulatory Visit: Payer: Self-pay | Admitting: Gastroenterology

## 2020-08-23 DIAGNOSIS — R1310 Dysphagia, unspecified: Secondary | ICD-10-CM

## 2020-08-30 ENCOUNTER — Other Ambulatory Visit: Payer: Self-pay

## 2020-08-30 ENCOUNTER — Ambulatory Visit
Admission: RE | Admit: 2020-08-30 | Discharge: 2020-08-30 | Disposition: A | Discharge status: Home / Self Care | Payer: Medicare Other | Source: Ambulatory Visit | Attending: Gastroenterology | Admitting: Gastroenterology

## 2020-08-30 DIAGNOSIS — R1310 Dysphagia, unspecified: Secondary | ICD-10-CM | POA: Insufficient documentation

## 2021-02-01 IMAGING — CT CT WRIST*L* W/O CM
3 of 4 series · 9 of 33 positions shown, 11 images · non-contrast
Comparison: X-ray 03/29/2020

CLINICAL DATA: Left wrist pain after fall

EXAM:
CT OF THE LEFT WRIST WITHOUT CONTRAST
TECHNIQUE: Multidetector CT imaging was performed according to the standard
protocol. Multiplanar CT image reconstructions were also generated.

[Series 7: cor st · coronal · 0.20mm/px · 3 of 53 slices shown]
[im 11/53  bone]
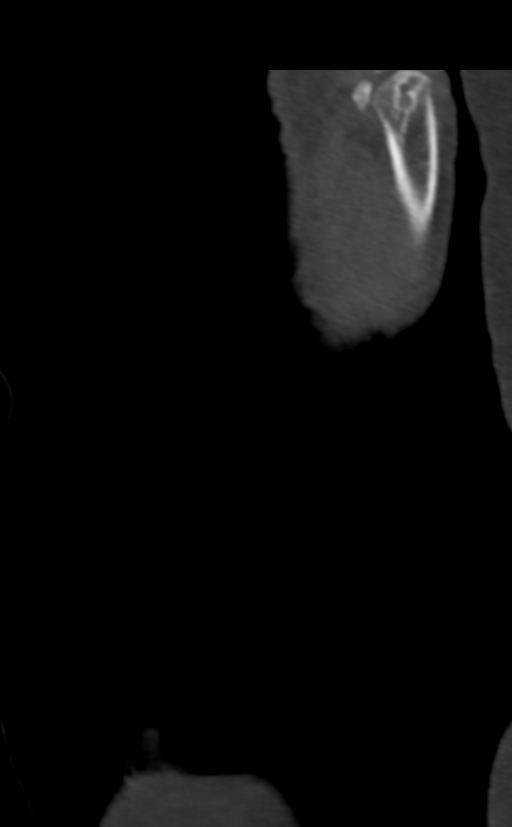
[im 21/53  bone]
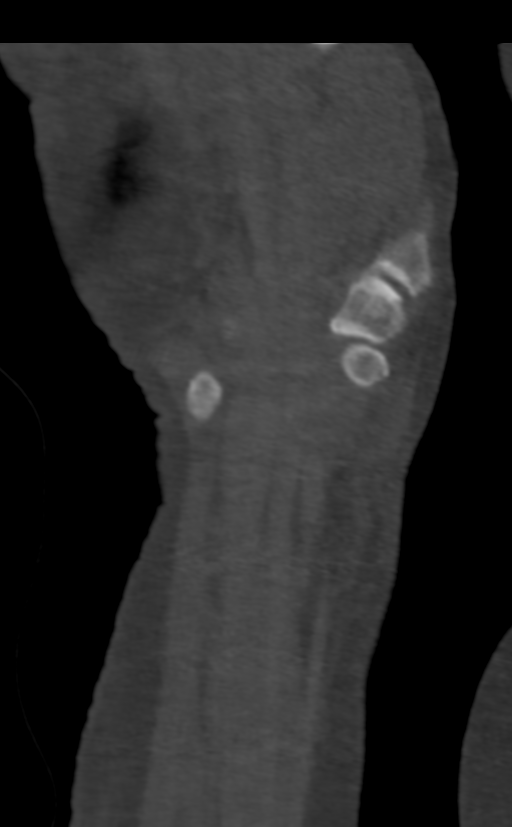
[im 32/53  bone]
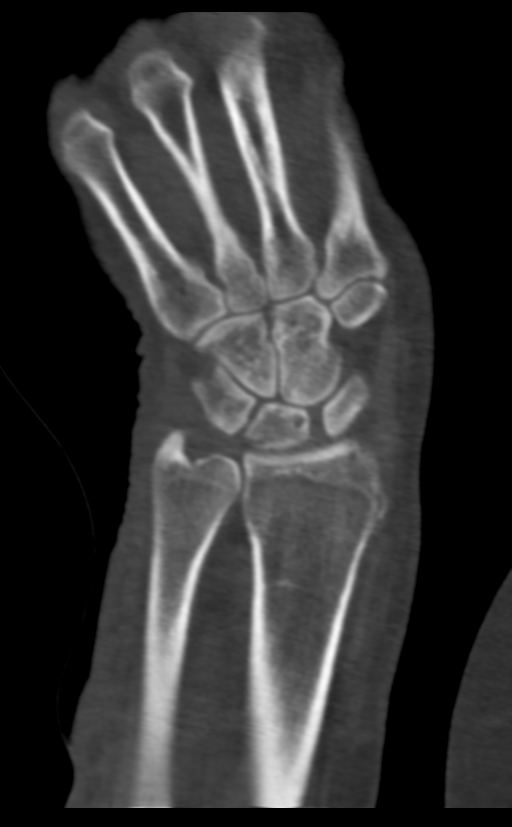

[Series 9: ax st · axial · 0.15mm/px · z∈[-851,-851]mm · 1 of 113 slices shown, 2 images]
[im 65/113  soft-tissue]
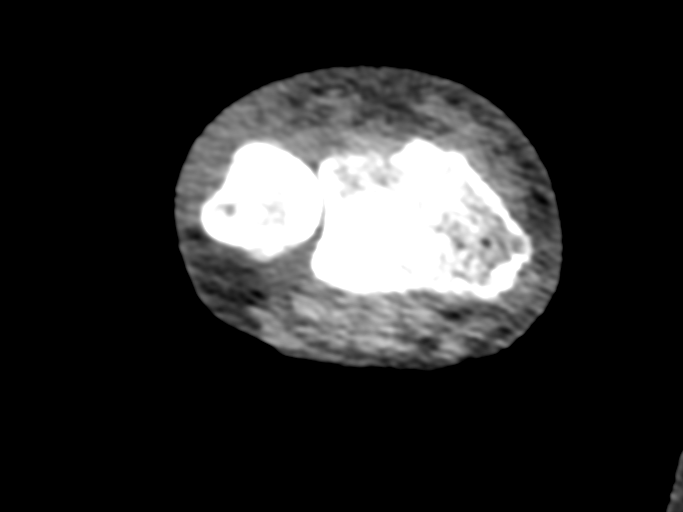
[im 65/113  bone]
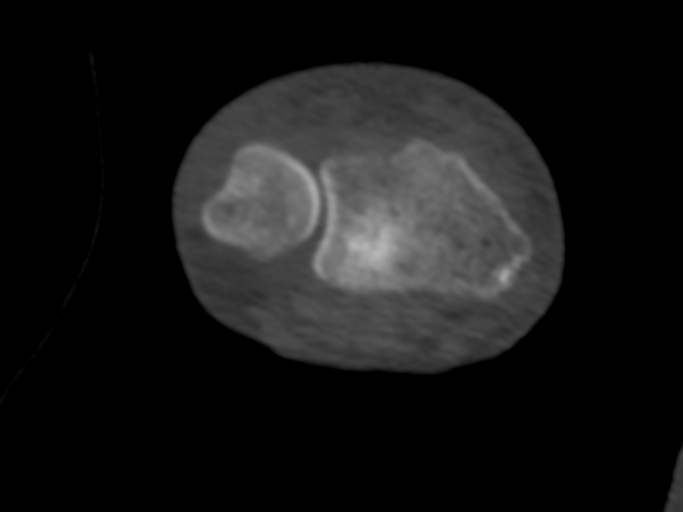

[Series 10: sag st · sagittal · 0.15mm/px · 5 of 70 slices shown, 6 images]
[im 18/70  bone]
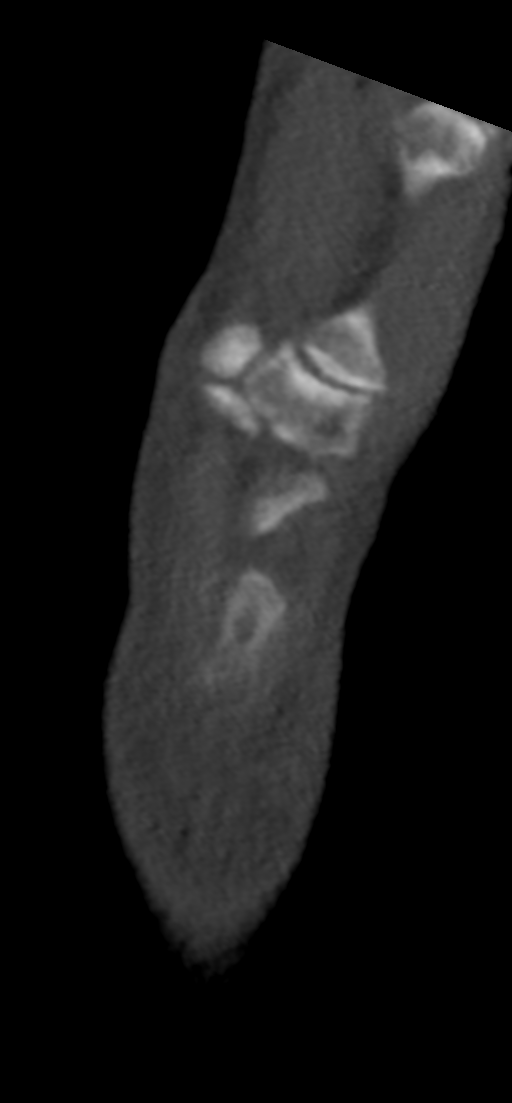
[im 24/70  bone]
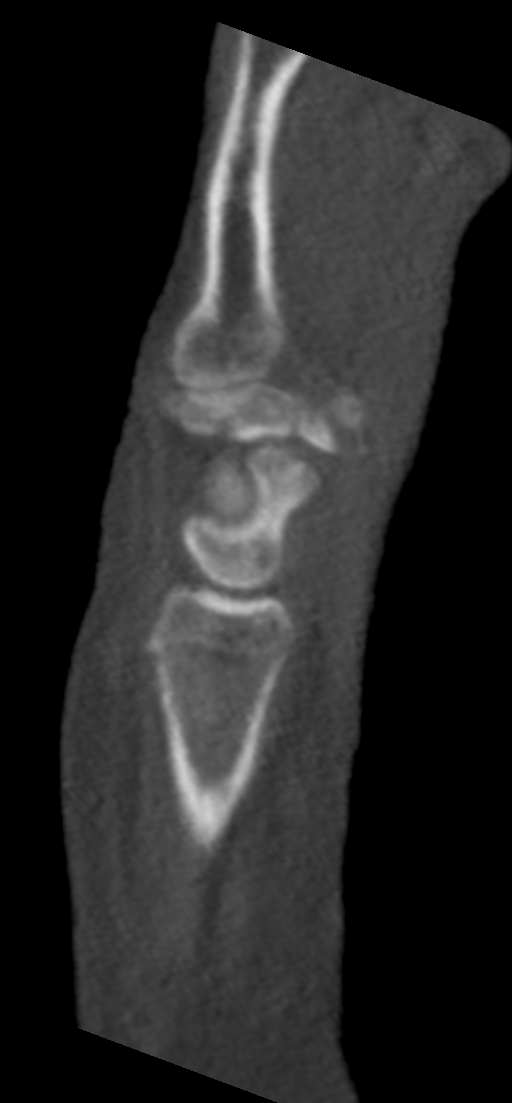
[im 29/70  bone]
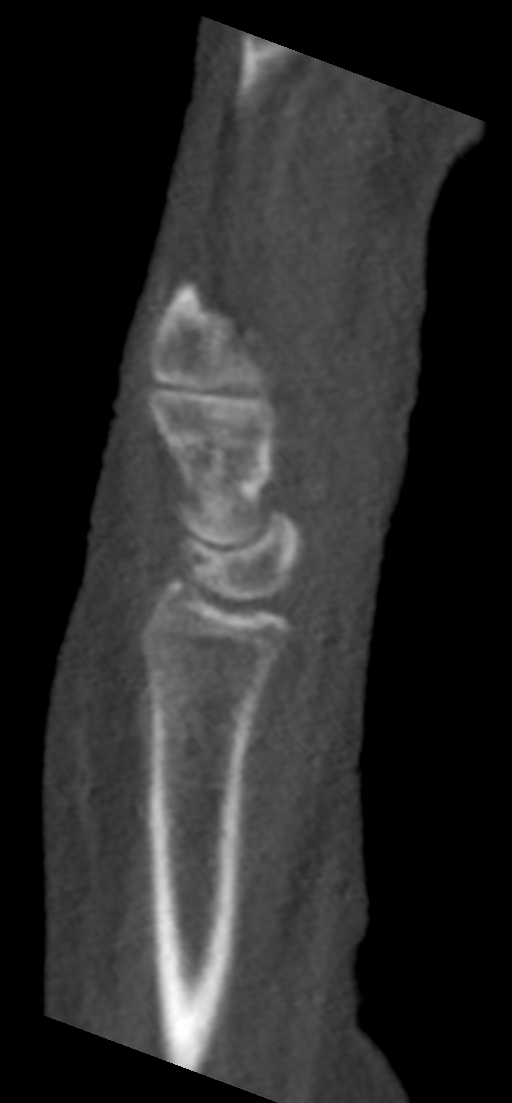
[im 35/70  soft-tissue]
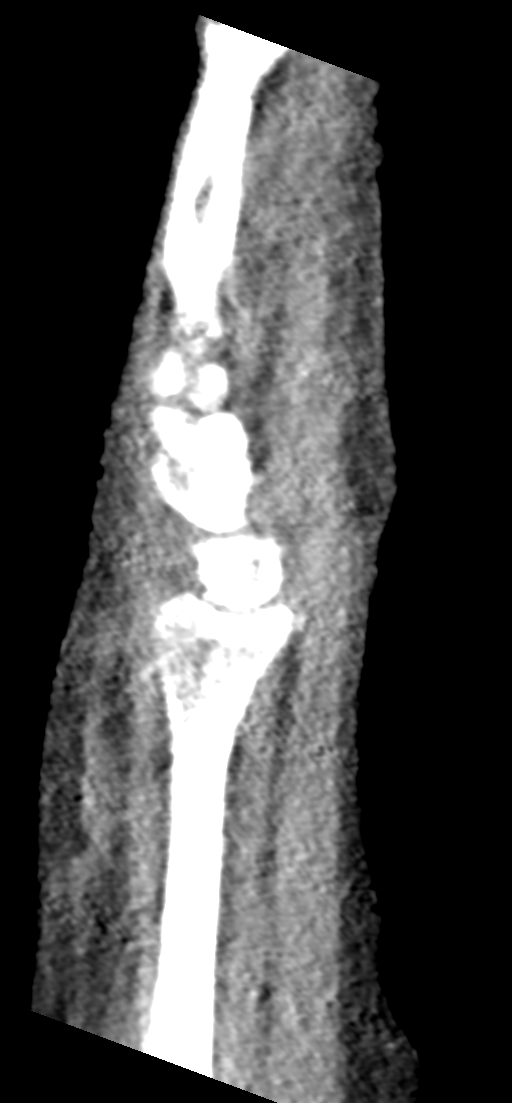
[im 35/70  bone]
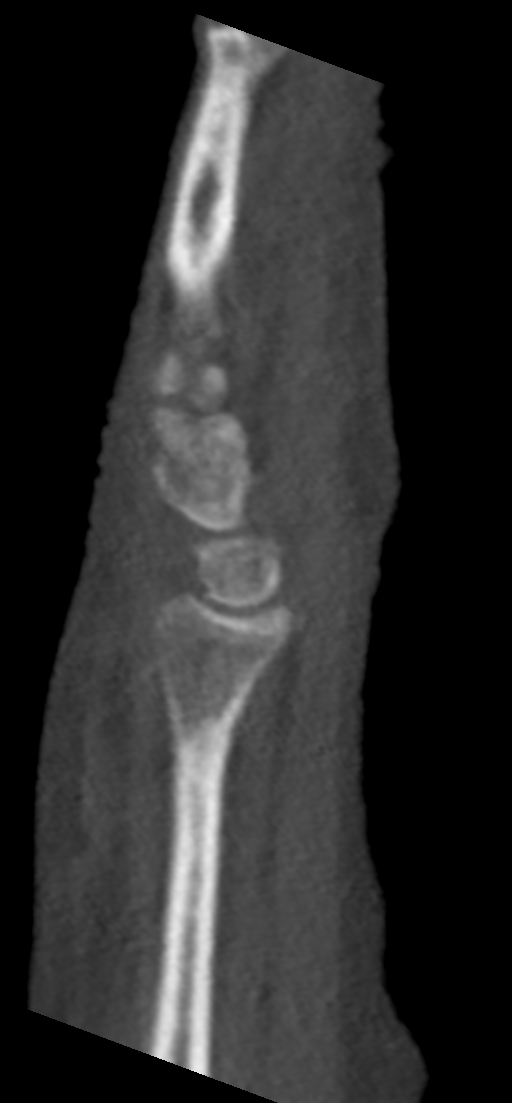
[im 41/70  bone]
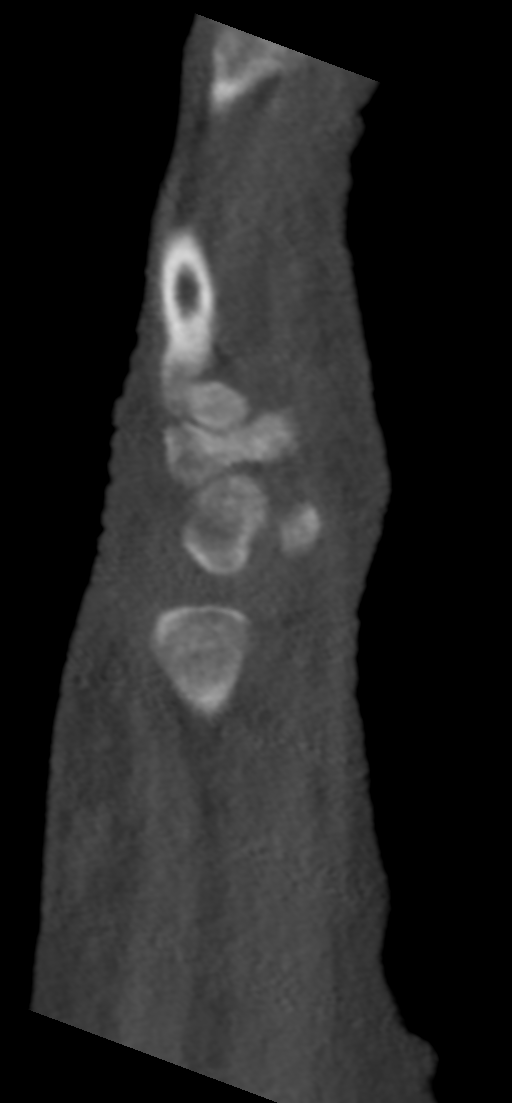

[9 of 33 positions shown; findings below may reference images not displayed]

FINDINGS: Technical note: Examination is degraded by beam hardening artifact
related to patient positioning.

Bones/Joint/Cartilage

Subtle nondisplaced fracture involving the dorsal aspect of the
triquetrum (series 12, images 9-10; series 8, image 41). No
additional fractures. Lunotriquetral interval is upper limits of
normal. Mild-moderate degenerative changes involving the first
carpometacarpal joint.

Ligaments

Suboptimally assessed by CT.

Muscles and Tendons

No obvious abnormality within the limitations of this exam.

Soft tissues

Soft tissue swelling and edema at the dorsal of the wrist and distal
forearm. No well-defined soft tissue hematoma.
IMPRESSION: 1. Subtle nondisplaced fracture involving the dorsal aspect of the
triquetrum.
2. Soft tissue swelling and edema at the dorsal of the wrist and
distal forearm.
